# Patient Record
Sex: Male | Born: 1970 | Race: White | Hispanic: No | Marital: Married | Smoking: Never smoker
Health system: Southern US, Community
[De-identification: ages and names within clinical notes are randomized; demographics above are authoritative.]

## PROBLEM LIST (undated history)

## (undated) DIAGNOSIS — T7840XA Allergy, unspecified, initial encounter: Secondary | ICD-10-CM

## (undated) DIAGNOSIS — R001 Bradycardia, unspecified: Secondary | ICD-10-CM

## (undated) DIAGNOSIS — K219 Gastro-esophageal reflux disease without esophagitis: Secondary | ICD-10-CM

## (undated) DIAGNOSIS — N200 Calculus of kidney: Secondary | ICD-10-CM

## (undated) DIAGNOSIS — G4730 Sleep apnea, unspecified: Secondary | ICD-10-CM

## (undated) DIAGNOSIS — I10 Essential (primary) hypertension: Secondary | ICD-10-CM

## (undated) DIAGNOSIS — D369 Benign neoplasm, unspecified site: Secondary | ICD-10-CM

## (undated) HISTORY — DX: Allergy, unspecified, initial encounter: T78.40XA

## (undated) HISTORY — DX: Benign neoplasm, unspecified site: D36.9

## (undated) HISTORY — DX: Gastro-esophageal reflux disease without esophagitis: K21.9

## (undated) HISTORY — PX: CARPAL TUNNEL RELEASE: SHX101

## (undated) HISTORY — PX: POLYPECTOMY: SHX149

## (undated) HISTORY — PX: WISDOM TOOTH EXTRACTION: SHX21

## (undated) HISTORY — PX: COLONOSCOPY: SHX174

## (undated) HISTORY — DX: Bradycardia, unspecified: R00.1

## (undated) HISTORY — PX: SHOULDER SURGERY: SHX246

## (undated) HISTORY — DX: Essential (primary) hypertension: I10

## (undated) HISTORY — DX: Sleep apnea, unspecified: G47.30

## (undated) HISTORY — DX: Calculus of kidney: N20.0

---

## 2002-06-01 ENCOUNTER — Inpatient Hospital Stay (HOSPITAL_COMMUNITY): Admission: EM | Admit: 2002-06-01 | Discharge: 2002-06-03 | Payer: Self-pay | Admitting: Cardiology

## 2012-12-09 DIAGNOSIS — M5412 Radiculopathy, cervical region: Secondary | ICD-10-CM | POA: Insufficient documentation

## 2014-01-11 DIAGNOSIS — S069XAA Unspecified intracranial injury with loss of consciousness status unknown, initial encounter: Secondary | ICD-10-CM | POA: Insufficient documentation

## 2014-06-21 ENCOUNTER — Encounter: Payer: Self-pay | Admitting: Internal Medicine

## 2014-08-31 ENCOUNTER — Encounter: Payer: Self-pay | Admitting: Internal Medicine

## 2014-08-31 ENCOUNTER — Ambulatory Visit (INDEPENDENT_AMBULATORY_CARE_PROVIDER_SITE_OTHER): Payer: Managed Care, Other (non HMO) | Admitting: Internal Medicine

## 2014-08-31 VITALS — BP 128/68 | HR 60 | Ht 65.25 in | Wt 288.1 lb

## 2014-08-31 DIAGNOSIS — R194 Change in bowel habit: Secondary | ICD-10-CM

## 2014-08-31 DIAGNOSIS — R1310 Dysphagia, unspecified: Secondary | ICD-10-CM

## 2014-08-31 DIAGNOSIS — K219 Gastro-esophageal reflux disease without esophagitis: Secondary | ICD-10-CM

## 2014-08-31 DIAGNOSIS — K625 Hemorrhage of anus and rectum: Secondary | ICD-10-CM

## 2014-08-31 MED ORDER — MOVIPREP 100 G PO SOLR: 1.0000 | Freq: Once | ORAL | Status: DC

## 2014-08-31 MED ORDER — HYDROCORTISONE ACETATE 25 MG RE SUPP: 25.0000 mg | Freq: Every day | RECTAL | Status: DC

## 2014-08-31 NOTE — Patient Instructions (Signed)
You have been scheduled for an endoscopy and colonoscopy. Please follow the written instructions given to you at your visit today. Please pick up your prep at the pharmacy within the next 1-3 days. If you use inhalers (even only as needed), please bring them with you on the day of your procedure. Your physician has requested that you go to www.startemmi.com and enter the access code given to you at your visit today. This web site gives a general overview about your procedure. However, you should still follow specific instructions given to you by our office regarding your preparation for the procedure.   As discussed with Dr. Henrene Pastor, use Metamucil, 1-2 tablespoons mixed with juice or water  We have sent the following medications to your pharmacy for you to pick up at your convenience:  Anusol [facility] suppositories  Continue taking Dexilant every morning.

## 2014-08-31 NOTE — Progress Notes (Signed)
HISTORY OF PRESENT ILLNESS:  Roy Fuentes is a 43 y.o. male , self-referred by Roy Fuentes (cardiology nurse in [place]) who accompanies him today. He has a history of sleep apnea and morbid obesity. He presents for evaluation of new onset dysphagia, chronic GERD, change in bowel habits with rectal bleeding, and a concern over the family history of Crohn's disease. The patient reports long-standing reflux symptoms. Over the past 3 months he has developed intermittent solid food dysphagia. Roy Fuentes has self treated him with Dexilant samples which he takes sporadically with improvement in symptoms. No abdominal pain or weight loss. Next, he reports some difficulty with Roy bowel habits. He has seen rectal bleeding intermittently. Generally mild though can be more prominent with blood in the toilet bowl. He has had spontaneous leakage of mucus-type fluid in the undergarments. He has used medicated hydrocortisone suppositories for probable hemorrhoids. Roy father had delayed diagnosis Crohn's disease with significant morbidity requiring surgery. He works in [employer].  REVIEW OF SYSTEMS:  All non-GI ROS negative upon comprehensive review  Past Medical History  Diagnosis Date  . GERD (gastroesophageal reflux disease)   . Kidney stones   . Sleep apnea     Past Surgical History  Procedure Laterality Date  . Carpal tunnel release      Right & Left Hand    Social History [place]  reports that he has never smoked. He has never used smokeless tobacco. He reports that he does not drink alcohol or use illicit drugs.  family history includes Colon polyps in Roy father; Crohn's disease in Roy father; Diabetes in Roy paternal grandmother; Heart disease in Roy father; Irritable bowel syndrome in Roy father; Kidney disease in Roy father. There is no history of Colon cancer or Esophageal cancer.  Allergies  Allergen Reactions  . Codeine Nausea And Vomiting       PHYSICAL  EXAMINATION: Vital signs: BP 128/68 mmHg  Pulse 60  Ht 5' 5.25" (1.657 m)  Wt 288 lb 2 oz (130.693 kg)  BMI 47.60 kg/m2  Constitutional: Obese, pleasant, generally well-appearing, no acute distress Psychiatric: alert and oriented x3, cooperative Eyes: extraocular movements intact, anicteric, conjunctiva pink Mouth: oral pharynx moist, no lesions Neck: supple no lymphadenopathy Cardiovascular: heart regular rate and rhythm, no murmur Lungs: clear to auscultation bilaterally Abdomen: soft, obese, nontender, nondistended, no obvious ascites, no peritoneal signs, normal bowel sounds, no organomegaly Rectal: Deferred until colonoscopy will Extremities: no lower extremity edema bilaterally Skin: no lesions on visible extremities Neuro: No focal deficits.   ASSESSMENT:  #1. Chronic GERD. Ongoing #2. Recent onset intermittent solid food dysphagia. Rule out peptic stricture #3. Difficulty with bowel habits, rectal bleeding, and soilage. Rule out benign anorectal pathology versus proctitis/colitis #4. Family history of Crohn's disease #5. Morbid obesity with sleep apnea  PLAN:  #1. Reflux precautions with attention to weight loss #2. Resume Dexilant, but take daily #3. Used medicated rectal suppositories nightly #4. Upper endoscopy with possible esophageal dilation.The nature of the procedure, as well as the risks, benefits, and alternatives were carefully and thoroughly reviewed with the patient. Ample time for discussion and questions allowed. The patient understood, was satisfied, and agreed to proceed. #5. Diagnostic colonoscopy. The nature of the procedure, as well as the risks, benefits, and alternatives were carefully and thoroughly reviewed with the patient. Ample time for discussion and questions allowed. The patient understood, was satisfied, and agreed to proceed. Movi prep prescribed. Patient instructed on its use

## 2014-09-05 ENCOUNTER — Ambulatory Visit (AMBULATORY_SURGERY_CENTER): Payer: Managed Care, Other (non HMO) | Admitting: Internal Medicine

## 2014-09-05 ENCOUNTER — Encounter: Payer: Self-pay | Admitting: Internal Medicine

## 2014-09-05 VITALS — BP 108/59 | HR 56 | Temp 96.9°F | Resp 22 | Ht 65.25 in | Wt 288.0 lb

## 2014-09-05 DIAGNOSIS — D123 Benign neoplasm of transverse colon: Secondary | ICD-10-CM

## 2014-09-05 DIAGNOSIS — K625 Hemorrhage of anus and rectum: Secondary | ICD-10-CM

## 2014-09-05 DIAGNOSIS — K219 Gastro-esophageal reflux disease without esophagitis: Secondary | ICD-10-CM

## 2014-09-05 MED ORDER — SODIUM CHLORIDE 0.9 % IV SOLN: 500.0000 mL | INTRAVENOUS | Status: DC

## 2014-09-05 NOTE — Op Note (Signed)
[place]  [address] [place], [id number]   COLONOSCOPY PROCEDURE REPORT  PATIENT: Roy Fuentes, Roy Fuentes  MR#: [id number] BIRTHDATE: September 10, 1971 , 58  yrs. old GENDER: male ENDOSCOPIST: Eustace Quail, MD REFERRED BY:.  Self / Office PROCEDURE DATE:  09/05/2014 PROCEDURE:   Colonoscopy with snare polypectomy x 1 First Screening Colonoscopy - Avg.  risk and is 50 yrs.  old or older - No.  Prior Negative Screening - Now for repeat screening. N/A  History of Adenoma - Now for follow-up colonoscopy & has been > or = to 3 yrs.  N/A  Polyps Removed Today? Yes. ASA CLASS:   Class II INDICATIONS:rectal bleeding and change in bowel habits. MEDICATIONS: Monitored anesthesia care and Propofol 300 mg IV  DESCRIPTION OF PROCEDURE:   After the risks benefits and alternatives of the procedure were thoroughly explained, informed consent was obtained.  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   The LB [id number]  endoscope was introduced through the anus and advanced to the cecum, which was identified by both the appendix and ileocecal valve. No adverse events experienced.   The quality of the prep was excellent, using MoviPrep  The instrument was then slowly withdrawn as the colon was fully examined.  COLON FINDINGS: A 5 mm  polyp was found in the transverse colon.  A polypectomy was performed with a cold snare.  The resection was complete, and tissue completely retrieved and sent to histology. There was mild diverticulosis in the left colon.   The examined terminal ileum was normal.   The examination was otherwise normal. Retroflexed views revealed internal hemorrhoids. The time to cecum=2 minutes 22 seconds.  Withdrawal time=8 minutes 34 seconds. The scope was withdrawn and the procedure completed. COMPLICATIONS: There were no immediate complications.  ENDOSCOPIC IMPRESSION: 1.   Single polyp measuring 5 mm in size was found in the  transverse colon; polypectomy was performed with a cold snare 2.   Mild diverticulosis was noted in the left colon 3.   The examined terminal ileum appeared to be normal 4.   The examination was otherwise normal  RECOMMENDATIONS: 1. Repeat colonoscopy in 5 years if polyp adenomatous; otherwise 10 years 2. Continue medicated suppositories at night until symptoms resolve 3. Start Metamucil 2 tablespoons daily and 12 ounces of water or juice 4. Upper endoscopy today (see report)  eSigned:  Eustace Quail, MD 09/05/2014 1:00 PM   cc: The Patient

## 2014-09-05 NOTE — Progress Notes (Signed)
Called to room to assist during endoscopic procedure.  Patient ID and intended procedure confirmed with present staff. Received instructions for my participation in the procedure from the performing physician.  

## 2014-09-05 NOTE — Patient Instructions (Signed)
YOU HAD AN ENDOSCOPIC PROCEDURE TODAY AT THE [facility]: Refer to the procedure report that was given to you for any specific questions about what was found during the examination.  If the procedure report does not answer your questions, please call your gastroenterologist to clarify.  If you requested that your care partner not be given the details of your procedure findings, then the procedure report has been included in a sealed envelope for you to review at your convenience later.  YOU SHOULD EXPECT: Some feelings of bloating in the abdomen. Passage of more gas than usual.  Walking can help get rid of the air that was put into your GI tract during the procedure and reduce the bloating. If you had a lower endoscopy (such as a colonoscopy or flexible sigmoidoscopy) you may notice spotting of blood in your stool or on the toilet paper. If you underwent a bowel prep for your procedure, then you may not have a normal bowel movement for a few days.  DIET: Your first meal following the procedure should be a light meal and then it is ok to progress to your normal diet.  A half-sandwich or bowl of soup is an example of a good first meal.  Heavy or fried foods are harder to digest and may make you feel nauseous or bloated.  Likewise meals heavy in dairy and vegetables can cause extra gas to form and this can also increase the bloating.  Drink plenty of fluids but you should avoid alcoholic beverages for 24 hours.  ACTIVITY: Your care partner should take you home directly after the procedure.  You should plan to take it easy, moving slowly for the rest of the day.  You can resume normal activity the day after the procedure however you should NOT DRIVE or use heavy machinery for 24 hours (because of the sedation medicines used during the test).    SYMPTOMS TO REPORT IMMEDIATELY: A gastroenterologist can be reached at any hour.  During normal business hours, 8:30 AM to 5:00 PM Monday through Friday,  call [phone].  After hours and on weekends, please call the GI answering service at [phone] who will take a message and have the physician on call contact you.   Following lower endoscopy (colonoscopy or flexible sigmoidoscopy):  Excessive amounts of blood in the stool  Significant tenderness or worsening of abdominal pains  Swelling of the abdomen that is new, acute  Fever of 100F or higher  FOLLOW UP: If any biopsies were taken you will be contacted by phone or by letter within the next 1-3 weeks.  Call your gastroenterologist if you have not heard about the biopsies in 3 weeks.  Our staff will call the home number listed on your records the next business day following your procedure to check on you and address any questions or concerns that you may have at that time regarding the information given to you following your procedure. This is a courtesy call and so if there is no answer at the home number and we have not heard from you through the emergency physician on call, we will assume that you have returned to your regular daily activities without incident.  SIGNATURES/CONFIDENTIALITY: You and/or your care partner have signed paperwork which will be entered into your electronic medical record.  These signatures attest to the fact that that the information above on your After Visit Summary has been reviewed and is understood.  Full responsibility of the confidentiality of this   discharge information lies with you and/or your care-partner.  GERD information given. Continue Dexilant once daily.  Polyp, diverticulosis, high fiber diet information given. Continue medicated suppositories at night until symptoms resolve. Metamucil 2 Tablespoons daily and 12oz  mixed with juice or water.

## 2014-09-05 NOTE — Op Note (Signed)
[place]  [address] [place], [id number]   ENDOSCOPY PROCEDURE REPORT  PATIENT: Roy Fuentes, Roy Fuentes  MR#: [id number] BIRTHDATE: 26-Jul-1971 , 26  yrs. old GENDER: male ENDOSCOPIST: Eustace Quail, MD REFERRED BY:  .  Self / Office PROCEDURE DATE:  09/05/2014 PROCEDURE:  EGD, diagnostic ASA CLASS:     Class II INDICATIONS:  history of esophageal reflux(chronic). Mild intermittent dysphagia to solids. Recently started on PPI regularly. MEDICATIONS: Monitored anesthesia care and Propofol 100 mg IV TOPICAL ANESTHETIC: none  DESCRIPTION OF PROCEDURE: After the risks benefits and alternatives of the procedure were thoroughly explained, informed consent was obtained.  The LB [id number] endoscope was introduced through the mouth and advanced to the second portion of the duodenum , Without limitations.  The instrument was slowly withdrawn as the mucosa was fully examined.    EXAM: The esophagus and gastroesophageal junction were completely normal in appearance.  The stomach was entered and closely examined.The antrum, angularis, and lesser curvature were well visualized, including a retroflexed view of the cardia and fundus. The stomach wall was normally distensable.  The scope passed easily through the pylorus into the duodenum.  Retroflexed views revealed no abnormalities.     The scope was then withdrawn from the patient and the procedure completed.  COMPLICATIONS: There were no immediate complications.  ENDOSCOPIC IMPRESSION: 1. Normal EGD 2. GERD  RECOMMENDATIONS: 1.  Anti-reflux regimen to be followed (diet, weight loss, and other measures) 2.  Continue PPI  (currently on Dexilant once daily)  REPEAT EXAM:  eSigned:  Eustace Quail, MD 09/05/2014 1:04 PM    CC:The Patient

## 2014-09-05 NOTE — Progress Notes (Signed)
Patient awakening,vss,report to rn 

## 2014-09-10 ENCOUNTER — Telehealth: Payer: Self-pay | Admitting: *Deleted

## 2014-09-10 NOTE — Telephone Encounter (Signed)
  Follow up Call-  Call back number 09/05/2014  Post procedure Call Back phone  # [phone]  Permission to leave phone message Yes     No answer at number given.  Left message on voicemail.

## 2014-09-11 ENCOUNTER — Encounter: Payer: Self-pay | Admitting: Internal Medicine

## 2016-04-28 ENCOUNTER — Encounter: Payer: Self-pay | Admitting: Cardiology

## 2017-01-14 ENCOUNTER — Encounter: Payer: Self-pay | Admitting: *Deleted

## 2017-01-15 ENCOUNTER — Encounter: Payer: Self-pay | Admitting: Cardiology

## 2017-01-15 ENCOUNTER — Ambulatory Visit (INDEPENDENT_AMBULATORY_CARE_PROVIDER_SITE_OTHER): Payer: Managed Care, Other (non HMO) | Admitting: Cardiology

## 2017-01-15 VITALS — BP 106/66 | HR 60 | Ht 67.0 in | Wt 304.0 lb

## 2017-01-15 DIAGNOSIS — R002 Palpitations: Secondary | ICD-10-CM

## 2017-01-15 DIAGNOSIS — R0789 Other chest pain: Secondary | ICD-10-CM

## 2017-01-15 DIAGNOSIS — R001 Bradycardia, unspecified: Secondary | ICD-10-CM

## 2017-01-15 NOTE — Progress Notes (Signed)
Clinical Summary Mr. Suchy is a 46 y.o.male seen as new patient, last seen by Dr Ron Parker several years ago  1. Chest pain - cath 2003: no significant CAD - no recent symptoms.    2. Palpitations - episode a few weeks ago after drinking coffee. Manually felt pulse, felt regular with some skipped beats - similar episode last year while taking antihistamines - denies any regular palpitations. No significant lighthenadess dizziness.   3. Bradycardia - reports long history of chronic low heart rates. - no recenty lightheadedness or dizziness    Past Medical History:  Diagnosis Date  . GERD (gastroesophageal reflux disease)   . Kidney stones   . Sleep apnea      Allergies  Allergen Reactions  . Codeine Nausea And Vomiting     Current Outpatient Prescriptions  Medication Sig Dispense Refill  . cetirizine (ZYRTEC) 10 MG chewable tablet Chew 10 mg by mouth daily at 10 pm.    . dexlansoprazole (DEXILANT) 60 MG capsule Take 60 mg by mouth as needed.    . diclofenac (VOLTAREN) 75 MG EC tablet Take 75 mg by mouth 2 (two) times daily.    . hydrocortisone (ANUSOL-HC) 25 MG suppository   6  . ibuprofen (ADVIL,MOTRIN) 800 MG tablet Take by mouth.     No current facility-administered medications for this visit.      Past Surgical History:  Procedure Laterality Date  . CARPAL TUNNEL RELEASE     Right & Left Hand     Allergies  Allergen Reactions  . Codeine Nausea And Vomiting      Family History  Problem Relation Age of Onset  . Colon polyps Father   . Crohn's disease Father   . Heart disease Father   . Irritable bowel syndrome Father   . Kidney disease Father   . Diabetes Paternal Grandmother   . Colon cancer Neg Hx   . Esophageal cancer Neg Hx      Social History Mr. Holland reports that he has never smoked. He has never used smokeless tobacco. Mr. Ostrom reports that he does not drink alcohol.   Review of Systems CONSTITUTIONAL: No weight loss,  fever, chills, weakness or fatigue.  HEENT: Eyes: No visual loss, blurred vision, double vision or yellow sclerae.No hearing loss, sneezing, congestion, runny nose or sore throat.  SKIN: No rash or itching.  CARDIOVASCULAR: per hpi RESPIRATORY: No shortness of breath, cough or sputum.  GASTROINTESTINAL: No anorexia, nausea, vomiting or diarrhea. No abdominal pain or blood.  GENITOURINARY: No burning on urination, no polyuria NEUROLOGICAL: No headache, dizziness, syncope, paralysis, ataxia, numbness or tingling in the extremities. No change in bowel or bladder control.  MUSCULOSKELETAL: No muscle, back pain, joint pain or stiffness.  LYMPHATICS: No enlarged nodes. No history of splenectomy.  PSYCHIATRIC: No history of depression or anxiety.  ENDOCRINOLOGIC: No reports of sweating, cold or heat intolerance. No polyuria or polydipsia.  Marland Kitchen   Physical Examination Vitals:   01/15/17 1434 01/15/17 1439  BP: 121/78 106/66  Pulse: 61 60   Vitals:   01/15/17 1434  Weight: (!) 304 lb (137.9 kg)  Height: 5\' 7"  (1.702 m)    Gen: resting comfortably, no acute distress HEENT: no scleral icterus, pupils equal round and reactive, no palptable cervical adenopathy,  CV: RRR, no m/r/g, no jvd Resp: Clear to auscultation bilaterally GI: abdomen is soft, non-tender, non-distended, normal bowel sounds, no hepatosplenomegaly MSK: extremities are warm, no edema.  Skin: warm, no rash Neuro:  no focal deficits Psych: appropriate affect    Assessment and Plan  1. Palpitations - fairly rare episodes, most recently brought on by caffeine - continue to monitor at this time, if increase consider home monitor  2. Bradycardia - patient reports history of chronic sinus bradycardia - rates today low normal, no symptoms. Continue to monitor  3. Chest pain - previous history with negative cath. No recent symptoms - continue to monitor.       Arnoldo Lenis, M.D.

## 2017-01-15 NOTE — Patient Instructions (Signed)

## 2017-12-17 ENCOUNTER — Ambulatory Visit (INDEPENDENT_AMBULATORY_CARE_PROVIDER_SITE_OTHER): Payer: Managed Care, Other (non HMO) | Admitting: Cardiology

## 2017-12-17 ENCOUNTER — Encounter: Payer: Self-pay | Admitting: *Deleted

## 2017-12-17 ENCOUNTER — Encounter: Payer: Self-pay | Admitting: Cardiology

## 2017-12-17 VITALS — BP 156/92 | HR 61 | Ht 67.0 in | Wt 304.8 lb

## 2017-12-17 DIAGNOSIS — R0789 Other chest pain: Secondary | ICD-10-CM

## 2017-12-17 DIAGNOSIS — R002 Palpitations: Secondary | ICD-10-CM | POA: Diagnosis not present

## 2017-12-17 DIAGNOSIS — R001 Bradycardia, unspecified: Secondary | ICD-10-CM

## 2017-12-17 DIAGNOSIS — I10 Essential (primary) hypertension: Secondary | ICD-10-CM | POA: Diagnosis not present

## 2017-12-17 MED ORDER — AMLODIPINE BESYLATE 5 MG PO TABS: 5.0000 mg | ORAL_TABLET | Freq: Every day | ORAL | 3 refills | Status: DC

## 2017-12-17 NOTE — Patient Instructions (Addendum)
Medication Instructions:  Your physician has recommended you make the following change in your medication: Start Norvasc 5 mg Daily    Labwork: NONE   Testing/Procedures: NONE   Follow-Up: Your physician wants you to follow-up in: 6 months with Dr. Harl Bowie. You will receive a reminder letter in the mail two months in advance. If you don't receive a letter, please call our office to schedule the follow-up appointment.   Any Other Special Instructions Will Be Listed Below (If Applicable). Your physician has requested that you regularly monitor and record your blood pressure readings at home for 2 weeks. Please use the same machine at the same time of day to check your readings and record them to bring to your follow-up visit.      If you need a refill on your cardiac medications before your next appointment, please call your pharmacy. Thank you for choosing [facility]!   DASH Eating Plan DASH stands for "Dietary Approaches to Stop Hypertension." The DASH eating plan is a healthy eating plan that has been shown to reduce high blood pressure (hypertension). It may also reduce your risk for type 2 diabetes, heart disease, and stroke. The DASH eating plan may also help with weight loss. What are tips for following this plan? General guidelines  Avoid eating more than 2,300 mg (milligrams) of salt (sodium) a day. If you have hypertension, you may need to reduce your sodium intake to 1,500 mg a day.  Limit alcohol intake to no more than 1 drink a day for nonpregnant women and 2 drinks a day for men. One drink equals 12 oz of beer, 5 oz of wine, or 1 oz of hard liquor.  Work with your health care provider to maintain a healthy body weight or to lose weight. Ask what an ideal weight is for you.  Get at least 30 minutes of exercise that causes your heart to beat faster (aerobic exercise) most days of the week. Activities may include walking, swimming, or biking.  Work with your  health care provider or diet and nutrition specialist (dietitian) to adjust your eating plan to your individual calorie needs. Reading food labels  Check food labels for the amount of sodium per serving. Choose foods with less than 5 percent of the Daily Value of sodium. Generally, foods with less than 300 mg of sodium per serving fit into this eating plan.  To find whole grains, look for the word "whole" as the first word in the ingredient list. Shopping  Buy products labeled as "low-sodium" or "no salt added."  Buy fresh foods. Avoid canned foods and premade or frozen meals. Cooking  Avoid adding salt when cooking. Use salt-free seasonings or herbs instead of table salt or sea salt. Check with your health care provider or pharmacist before using salt substitutes.  Do not fry foods. Cook foods using healthy methods such as baking, boiling, grilling, and broiling instead.  Cook with heart-healthy oils, such as olive, canola, soybean, or sunflower oil. Meal planning   Eat a balanced diet that includes: ? 5 or more servings of fruits and vegetables each day. At each meal, try to fill half of your plate with fruits and vegetables. ? Up to 6-8 servings of whole grains each day. ? Less than 6 oz of lean meat, poultry, or fish each day. A 3-oz serving of meat is about the same size as a deck of cards. One egg equals 1 oz. ? 2 servings of low-fat dairy each day. ?  A serving of nuts, seeds, or beans 5 times each week. ? Heart-healthy fats. Healthy fats called Omega-3 fatty acids are found in foods such as flaxseeds and coldwater fish, like sardines, salmon, and mackerel.  Limit how much you eat of the following: ? Canned or prepackaged foods. ? Food that is high in trans fat, such as fried foods. ? Food that is high in saturated fat, such as fatty meat. ? Sweets, desserts, sugary drinks, and other foods with added sugar. ? Full-fat dairy products.  Do not salt foods before eating.  Try  to eat at least 2 vegetarian meals each week.  Eat more home-cooked food and less restaurant, buffet, and fast food.  When eating at a restaurant, ask that your food be prepared with less salt or no salt, if possible. What foods are recommended? The items listed may not be a complete list. Talk with your dietitian about what dietary choices are best for you. Grains Whole-grain or whole-wheat bread. Whole-grain or whole-wheat pasta. Brown rice. [place]. Bulgur. Whole-grain and low-sodium cereals. Pita bread. Low-fat, low-sodium crackers. Whole-wheat flour tortillas. Vegetables Fresh or frozen vegetables (raw, steamed, roasted, or grilled). Low-sodium or reduced-sodium tomato and vegetable juice. Low-sodium or reduced-sodium tomato sauce and tomato paste. Low-sodium or reduced-sodium canned vegetables. Fruits All fresh, dried, or frozen fruit. Canned fruit in natural juice (without added sugar). Meat and other protein foods Skinless chicken or [place]. Ground chicken or [place]. Pork with fat trimmed off. Fish and seafood. Egg whites. Dried beans, peas, or lentils. Unsalted nuts, nut butters, and seeds. Unsalted canned beans. Lean cuts of beef with fat trimmed off. Low-sodium, lean deli meat. Dairy Low-fat (1%) or fat-free (skim) milk. Fat-free, low-fat, or reduced-fat cheeses. Nonfat, low-sodium ricotta or cottage cheese. Low-fat or nonfat yogurt. Low-fat, low-sodium cheese. Fats and oils Soft margarine without trans fats. Vegetable oil. Low-fat, reduced-fat, or light mayonnaise and salad dressings (reduced-sodium). Canola, safflower, olive, soybean, and sunflower oils. Avocado. Seasoning and other foods Herbs. Spices. Seasoning mixes without salt. Unsalted popcorn and pretzels. Fat-free sweets. What foods are not recommended? The items listed may not be a complete list. Talk with your dietitian about what dietary choices are best for you. Grains Baked goods made with fat, such as  croissants, muffins, or some breads. Dry pasta or rice meal packs. Vegetables Creamed or fried vegetables. Vegetables in a cheese sauce. Regular canned vegetables (not low-sodium or reduced-sodium). Regular canned tomato sauce and paste (not low-sodium or reduced-sodium). Regular tomato and vegetable juice (not low-sodium or reduced-sodium). Angie Fava. Olives. Fruits Canned fruit in a light or heavy syrup. Fried fruit. Fruit in cream or butter sauce. Meat and other protein foods Fatty cuts of meat. Ribs. Fried meat. Berniece Salines. Sausage. Bologna and other processed lunch meats. Salami. Fatback. Hotdogs. Bratwurst. Salted nuts and seeds. Canned beans with added salt. Canned or smoked fish. Whole eggs or egg yolks. Chicken or [place] with skin. Dairy Whole or 2% milk, cream, and half-and-half. Whole or full-fat cream cheese. Whole-fat or sweetened yogurt. Full-fat cheese. Nondairy creamers. Whipped toppings. Processed cheese and cheese spreads. Fats and oils Butter. Stick margarine. Lard. Shortening. Ghee. Bacon fat. Tropical oils, such as coconut, palm kernel, or palm oil. Seasoning and other foods Salted popcorn and pretzels. Onion salt, garlic salt, seasoned salt, table salt, and sea salt. Worcestershire sauce. Tartar sauce. Barbecue sauce. Teriyaki sauce. Soy sauce, including reduced-sodium. Steak sauce. Canned and packaged gravies. Fish sauce. Oyster sauce. Cocktail sauce. Horseradish that you find on the shelf. Ketchup.  Mustard. Meat flavorings and tenderizers. Bouillon cubes. Hot sauce and Tabasco sauce. Premade or packaged marinades. Premade or packaged taco seasonings. Relishes. Regular salad dressings. Where to find more information:  National Heart, Lung, and [place]: https://wilson-eaton.com/  American Heart Association: www.heart.org Summary  The DASH eating plan is a healthy eating plan that has been shown to reduce high blood pressure (hypertension). It may also reduce your risk for type 2  diabetes, heart disease, and stroke.  With the DASH eating plan, you should limit salt (sodium) intake to 2,300 mg a day. If you have hypertension, you may need to reduce your sodium intake to 1,500 mg a day.  When on the DASH eating plan, aim to eat more fresh fruits and vegetables, whole grains, lean proteins, low-fat dairy, and heart-healthy fats.  Work with your health care provider or diet and nutrition specialist (dietitian) to adjust your eating plan to your individual calorie needs. This information is not intended to replace advice given to you by your health care provider. Make sure you discuss any questions you have with your health care provider. Document Released: 09/17/2011 Document Revised: 09/21/2016 Document Reviewed: 09/21/2016 Elsevier Interactive Patient Education  [employer].

## 2017-12-17 NOTE — Progress Notes (Signed)
Clinical Summary Roy Fuentes is a 47 y.o.male seen today for follow up of the following medical problems.   1. Chest pain - cath 2003: no significant CAD  he denies any recent symptoms.     2. History of Palpitations - no recent symptoms.    3. Bradycardia - reports long history of chronic low heart rates. - denies any recent symptoms  4. Elevated bp - takes ibupforen for muscle pains and joint pains at home - takes 800mg  daily. Rare caffeine, no EtoH.   5. OSA - compliant with cpap - monitored by Dr Volanda Napoleon.      Past Medical History:  Diagnosis Date  . GERD (gastroesophageal reflux disease)   . Kidney stones   . Sleep apnea      Allergies  Allergen Reactions  . Codeine Nausea And Vomiting     Current Outpatient Medications  Medication Sig Dispense Refill  . cetirizine (ZYRTEC) 10 MG chewable tablet Chew 10 mg by mouth daily at 10 pm.    . dexlansoprazole (DEXILANT) 60 MG capsule Take 60 mg by mouth as needed.    . hydrocortisone (ANUSOL-HC) 25 MG suppository   6  . ibuprofen (ADVIL,MOTRIN) 800 MG tablet Take by mouth.    . Multiple Vitamin (MULTIVITAMIN) tablet Take 1 tablet by mouth daily.    . TURMERIC PO Take 200 mg by mouth daily.     No current facility-administered medications for this visit.      Past Surgical History:  Procedure Laterality Date  . CARPAL TUNNEL RELEASE     Right & Left Hand     Allergies  Allergen Reactions  . Codeine Nausea And Vomiting      Family History  Problem Relation Age of Onset  . Colon polyps Father   . Crohn's disease Father   . Heart disease Father   . Irritable bowel syndrome Father   . Kidney disease Father   . Diabetes Paternal Grandmother   . Colon cancer Neg Hx   . Esophageal cancer Neg Hx      Social History Roy Fuentes reports that  has never smoked. he has never used smokeless tobacco. Roy Fuentes reports that he does not drink alcohol.   Review of Systems CONSTITUTIONAL: No  weight loss, fever, chills, weakness or fatigue.  HEENT: Eyes: No visual loss, blurred vision, double vision or yellow sclerae.No hearing loss, sneezing, congestion, runny nose or sore throat.  SKIN: No rash or itching.  CARDIOVASCULAR: per hpi RESPIRATORY: No shortness of breath, cough or sputum.  GASTROINTESTINAL: No anorexia, nausea, vomiting or diarrhea. No abdominal pain or blood.  GENITOURINARY: No burning on urination, no polyuria NEUROLOGICAL: No headache, dizziness, syncope, paralysis, ataxia, numbness or tingling in the extremities. No change in bowel or bladder control.  MUSCULOSKELETAL: No muscle, back pain, joint pain or stiffness.  LYMPHATICS: No enlarged nodes. No history of splenectomy.  PSYCHIATRIC: No history of depression or anxiety.  ENDOCRINOLOGIC: No reports of sweating, cold or heat intolerance. No polyuria or polydipsia.  Marland Kitchen   Physical Examination Vitals:   12/17/17 1311  BP: (!) 156/92  Pulse: 61  SpO2: 98%   Vitals:   12/17/17 1311  Weight: (!) 304 lb 12.8 oz (138.3 kg)  Height: 5\' 7"  (1.702 m)    Gen: resting comfortably, no acute distress HEENT: no scleral icterus, pupils equal round and reactive, no palptable cervical adenopathy,  CV: RRR, no m/r/g, no jvd Resp: Clear to auscultation bilaterally GI: abdomen is  soft, non-tender, non-distended, normal bowel sounds, no hepatosplenomegaly MSK: extremities are warm, no edema.  Skin: warm, no rash Neuro:  no focal deficits Psych: appropriate affect   Diagnostic Studies     Assessment and Plan  1. Palpitations - no recent symptoms, continue to monitor  2. Chest pain - no recent symptoms, we will continue to monitor   3. Bradycardia - chronic and asymptomatic, continue to monitor at this time.   4. HTN - elevated in clinic -  submit bp log in 2 weeks. Given info on DASH diet - pending bp log, may require titration of bp meds.       Arnoldo Lenis, M.D

## 2017-12-21 ENCOUNTER — Encounter: Payer: Self-pay | Admitting: Cardiology

## 2018-08-19 ENCOUNTER — Ambulatory Visit (INDEPENDENT_AMBULATORY_CARE_PROVIDER_SITE_OTHER): Payer: Managed Care, Other (non HMO) | Admitting: Cardiology

## 2018-08-19 ENCOUNTER — Encounter: Payer: Self-pay | Admitting: *Deleted

## 2018-08-19 ENCOUNTER — Encounter: Payer: Self-pay | Admitting: Cardiology

## 2018-08-19 VITALS — BP 110/75 | HR 67 | Ht 67.0 in | Wt 301.0 lb

## 2018-08-19 DIAGNOSIS — R0789 Other chest pain: Secondary | ICD-10-CM

## 2018-08-19 DIAGNOSIS — R001 Bradycardia, unspecified: Secondary | ICD-10-CM

## 2018-08-19 DIAGNOSIS — I10 Essential (primary) hypertension: Secondary | ICD-10-CM

## 2018-08-19 MED ORDER — AMLODIPINE BESYLATE 5 MG PO TABS: 5.0000 mg | ORAL_TABLET | Freq: Every day | ORAL | 1 refills | Status: DC

## 2018-08-19 NOTE — Progress Notes (Signed)
Clinical Summary Roy Fuentes is a 47 y.o.male seen today for follow up of the following medical problems.    1.Chest pain - cath 2003: no significant CAD - no recent chest pain, SOB or DOE.    2. Bradycardia - reports long history of chronic low heart rates. - no recent lightheadedness or dizziness  3. HTN - on norvasc 5mg  daily. Can feel sluggish, Roy Fuentes is unsure if related. Reports also started on flomax, since recently stopping has started to feel better.       4. OSA - compliant with cpap - monitored by Dr Volanda Napoleon. Normal check recently.  Past Medical History:  Diagnosis Date  . GERD (gastroesophageal reflux disease)   . Kidney stones   . Sleep apnea      Allergies  Allergen Reactions  . Codeine Nausea And Vomiting     Current Outpatient Medications  Medication Sig Dispense Refill  . amLODipine (NORVASC) 5 MG tablet Take 1 tablet (5 mg total) by mouth daily. 90 tablet 3  . cetirizine (ZYRTEC) 10 MG chewable tablet Chew 10 mg by mouth daily at 10 pm.    . ibuprofen (ADVIL,MOTRIN) 800 MG tablet Take by mouth.    . metaxalone (SKELAXIN) 800 MG tablet Take 800 mg by mouth as needed.     . Multiple Vitamin (MULTIVITAMIN) tablet Take 1 tablet by mouth daily.     No current facility-administered medications for this visit.      Past Surgical History:  Procedure Laterality Date  . CARPAL TUNNEL RELEASE     Right & Left Hand     Allergies  Allergen Reactions  . Codeine Nausea And Vomiting      Family History  Problem Relation Age of Onset  . Colon polyps Father   . Crohn's disease Father   . Heart disease Father   . Irritable bowel syndrome Father   . Kidney disease Father   . Hypertension Father   . Heart failure Mother   . Atrial fibrillation Mother   . Diabetes Paternal Grandmother   . Colon cancer Neg Hx   . Esophageal cancer Neg Hx      Social History Mr. Mckim reports that Roy Fuentes has never smoked. Roy Fuentes has never used smokeless  tobacco. Mr. Figgs reports that Roy Fuentes does not drink alcohol.   Review of Systems CONSTITUTIONAL: recent fatigue  HEENT: Eyes: No visual loss, blurred vision, double vision or yellow sclerae.No hearing loss, sneezing, congestion, runny nose or sore throat.  SKIN: No rash or itching.  CARDIOVASCULAR: per hpi RESPIRATORY: No shortness of breath, cough or sputum.  GASTROINTESTINAL: No anorexia, nausea, vomiting or diarrhea. No abdominal pain or blood.  GENITOURINARY: No burning on urination, no polyuria NEUROLOGICAL: No headache, dizziness, syncope, paralysis, ataxia, numbness or tingling in the extremities. No change in bowel or bladder control.  MUSCULOSKELETAL: No muscle, back pain, joint pain or stiffness.  LYMPHATICS: No enlarged nodes. No history of splenectomy.  PSYCHIATRIC: No history of depression or anxiety.  ENDOCRINOLOGIC: No reports of sweating, cold or heat intolerance. No polyuria or polydipsia.  Marland Kitchen   Physical Examination Vitals:   08/19/18 1530  BP: 110/75  Pulse: 67  SpO2: 96%   Vitals:   08/19/18 1530  Weight: (!) 301 lb (136.5 kg)  Height: 5\' 7"  (1.702 m)    Gen: resting comfortably, no acute distress HEENT: no scleral icterus, pupils equal round and reactive, no palptable cervical adenopathy,  CV: RRR, no m/r/g, no jvd Resp: Clear  to auscultation bilaterally GI: abdomen is soft, non-tender, non-distended, normal bowel sounds, no hepatosplenomegaly MSK: extremities are warm, no edema.  Skin: warm, no rash Neuro:  no focal deficits Psych: appropriate affect     Assessment and Plan  1. Chest pain - denies any recent symptoms, continue to monitor.   2. Bradycardia - chronic and asymptomatic - continue to monitor.   3. HTN - looks good today in clinic. Roy Fuentes thinks Roy Fuentes recent sluggishness feelings may be related to either the norvasc or flomax, Roy Fuentes has recentlty stopped the flomax and is feeling better. If becomes a problems again could try changing him  off norvac.       Arnoldo Lenis, M.D.

## 2018-08-19 NOTE — Patient Instructions (Signed)

## 2018-08-22 ENCOUNTER — Encounter: Payer: Self-pay | Admitting: *Deleted

## 2018-08-22 ENCOUNTER — Encounter: Payer: Self-pay | Admitting: Cardiology

## 2019-03-23 ENCOUNTER — Telehealth: Payer: Self-pay | Admitting: Cardiology

## 2019-03-23 NOTE — Telephone Encounter (Signed)

## 2019-03-24 ENCOUNTER — Ambulatory Visit (INDEPENDENT_AMBULATORY_CARE_PROVIDER_SITE_OTHER): Payer: Managed Care, Other (non HMO) | Admitting: Cardiology

## 2019-03-24 ENCOUNTER — Ambulatory Visit: Payer: Managed Care, Other (non HMO) | Admitting: Cardiology

## 2019-03-24 ENCOUNTER — Encounter: Payer: Self-pay | Admitting: Cardiology

## 2019-03-24 ENCOUNTER — Other Ambulatory Visit: Payer: Self-pay

## 2019-03-24 VITALS — BP 109/68 | HR 62 | Temp 98.7°F | Ht 67.0 in | Wt 301.8 lb

## 2019-03-24 DIAGNOSIS — I10 Essential (primary) hypertension: Secondary | ICD-10-CM | POA: Diagnosis not present

## 2019-03-24 MED ORDER — AMLODIPINE BESYLATE 2.5 MG PO TABS: 2.5000 mg | ORAL_TABLET | Freq: Every day | ORAL | 1 refills | Status: DC

## 2019-03-24 NOTE — Progress Notes (Signed)
Clinical Summary Mr. Rominger is a 48 y.o.male seen today for follow up of the following medical problems.   1.Chest pain - cath 2003: no significant CAD - denies any recent symptoms   2. Bradycardia - reports long history of chronic low heart rates. -denies any significant symptoms  3.HTN  - taking norvasc 5mg  in AM causes significant fatigue. Taking 2.5 mg in evening, tolerating well.  - home bp's 110s-130s/60-70s  4. OSA - monitored by Dr Volanda Napoleon.Normal check recently  - compliant with cpap Past Medical History:  Diagnosis Date  . GERD (gastroesophageal reflux disease)   . Kidney stones   . Sleep apnea      Allergies  Allergen Reactions  . Codeine Nausea And Vomiting     Current Outpatient Medications  Medication Sig Dispense Refill  . amLODipine (NORVASC) 5 MG tablet Take 1 tablet (5 mg total) by mouth daily. 90 tablet 1  . cetirizine (ZYRTEC) 10 MG chewable tablet Chew 10 mg by mouth daily at 10 pm.    . ibuprofen (ADVIL,MOTRIN) 800 MG tablet Take by mouth.    . metaxalone (SKELAXIN) 800 MG tablet Take 800 mg by mouth as needed.     . Multiple Vitamin (MULTIVITAMIN) tablet Take 1 tablet by mouth daily.     No current facility-administered medications for this visit.      Past Surgical History:  Procedure Laterality Date  . CARPAL TUNNEL RELEASE     Right & Left Hand     Allergies  Allergen Reactions  . Codeine Nausea And Vomiting      Family History  Problem Relation Age of Onset  . Colon polyps Father   . Crohn's disease Father   . Heart disease Father   . Irritable bowel syndrome Father   . Kidney disease Father   . Hypertension Father   . Heart failure Mother   . Atrial fibrillation Mother   . Diabetes Paternal Grandmother   . Colon cancer Neg Hx   . Esophageal cancer Neg Hx      Social History Mr. Silveria reports that he has never smoked. He has never used smokeless tobacco. Mr. Dyar reports no history of  alcohol use.   Review of Systems CONSTITUTIONAL: No weight loss, fever, chills, weakness or fatigue.  HEENT: Eyes: No visual loss, blurred vision, double vision or yellow sclerae.No hearing loss, sneezing, congestion, runny nose or sore throat.  SKIN: No rash or itching.  CARDIOVASCULAR: per hpi RESPIRATORY: No shortness of breath, cough or sputum.  GASTROINTESTINAL: No anorexia, nausea, vomiting or diarrhea. No abdominal pain or blood.  GENITOURINARY: No burning on urination, no polyuria NEUROLOGICAL: No headache, dizziness, syncope, paralysis, ataxia, numbness or tingling in the extremities. No change in bowel or bladder control.  MUSCULOSKELETAL: No muscle, back pain, joint pain or stiffness.  LYMPHATICS: No enlarged nodes. No history of splenectomy.  PSYCHIATRIC: No history of depression or anxiety.  ENDOCRINOLOGIC: No reports of sweating, cold or heat intolerance. No polyuria or polydipsia.  Marland Kitchen   Physical Examination Today's Vitals   03/24/19 1556  BP: 109/68  Pulse: 62  Temp: 98.7 F (37.1 C)  SpO2: 96%  Weight: (!) 301 lb 12.8 oz (136.9 kg)  Height: 5\' 7"  (1.702 m)   Body mass index is 47.27 kg/m.  Gen: resting comfortably, no acute distress HEENT: no scleral icterus, pupils equal round and reactive, no palptable cervical adenopathy,  CV: RRR, no m/r/g, no jvd Resp: Clear to auscultation bilaterally GI: abdomen  is soft, non-tender, non-distended, normal bowel sounds, no hepatosplenomegaly MSK: extremities are warm, no edema.  Skin: warm, no rash Neuro:  no focal deficits Psych: appropriate affect     Assessment and Plan  1. HTN - bp's at goal, change norvasc to 2.5mg  daily like he has been taking, 5mg  daily causes fatigue   2. Severe obesity - discussed dietary changes, lifestyle changes.     F/u 1 year  Arnoldo Lenis, M.D.

## 2019-03-24 NOTE — Patient Instructions (Signed)
Your physician wants you to follow-up in: [place] will receive a reminder letter in the mail two months in advance. If you don't receive a letter, please call our office to schedule the follow-up appointment.  Your physician has recommended you make the following change in your medication:   DECREASE AMLODIPINE 2.5 MG DAILY   Thank you for choosing [facility]!!

## 2019-04-21 ENCOUNTER — Ambulatory Visit: Payer: Managed Care, Other (non HMO) | Admitting: Student

## 2019-06-20 ENCOUNTER — Other Ambulatory Visit: Payer: Self-pay

## 2019-06-20 ENCOUNTER — Emergency Department (HOSPITAL_COMMUNITY)
Admission: EM | Admit: 2019-06-20 | Discharge: 2019-06-20 | Disposition: A | Discharge status: Home / Self Care | Payer: Managed Care, Other (non HMO) | Attending: Emergency Medicine | Admitting: Emergency Medicine

## 2019-06-20 ENCOUNTER — Encounter (HOSPITAL_COMMUNITY): Payer: Self-pay | Admitting: Emergency Medicine

## 2019-06-20 ENCOUNTER — Emergency Department (HOSPITAL_COMMUNITY): Payer: Managed Care, Other (non HMO)

## 2019-06-20 DIAGNOSIS — Z79899 Other long term (current) drug therapy: Secondary | ICD-10-CM | POA: Diagnosis not present

## 2019-06-20 DIAGNOSIS — R0602 Shortness of breath: Secondary | ICD-10-CM | POA: Diagnosis present

## 2019-06-20 DIAGNOSIS — U071 COVID-19: Secondary | ICD-10-CM | POA: Diagnosis not present

## 2019-06-20 LAB — CBC WITH DIFFERENTIAL/PLATELET
Abs Immature Granulocytes: 1 10*3/uL — ABNORMAL HIGH (ref 0.00–0.07)
Basophils Absolute: 0.1 10*3/uL (ref 0.0–0.1)
Basophils Relative: 1 %
Eosinophils Absolute: 0 10*3/uL (ref 0.0–0.5)
Eosinophils Relative: 0 %
HCT: 46.4 % (ref 39.0–52.0)
Hemoglobin: 15.1 g/dL (ref 13.0–17.0)
Immature Granulocytes: 7 %
Lymphocytes Relative: 11 %
Lymphs Abs: 1.7 10*3/uL (ref 0.7–4.0)
MCH: 31.3 pg (ref 26.0–34.0)
MCHC: 32.5 g/dL (ref 30.0–36.0)
MCV: 96.1 fL (ref 80.0–100.0)
Monocytes Absolute: 1 10*3/uL (ref 0.1–1.0)
Monocytes Relative: 7 %
Neutro Abs: 11.1 10*3/uL — ABNORMAL HIGH (ref 1.7–7.7)
Neutrophils Relative %: 74 %
Platelets: 385 10*3/uL (ref 150–400)
RBC: 4.83 MIL/uL (ref 4.22–5.81)
RDW: 12.6 % (ref 11.5–15.5)
WBC: 15 10*3/uL — ABNORMAL HIGH (ref 4.0–10.5)
nRBC: 0 % (ref 0.0–0.2)

## 2019-06-20 LAB — TROPONIN I (HIGH SENSITIVITY)
Troponin I (High Sensitivity): 5 ng/L (ref <18)
Troponin I (High Sensitivity): 3 ng/L (ref <18)

## 2019-06-20 LAB — COMPREHENSIVE METABOLIC PANEL
ALT: 84 U/L — ABNORMAL HIGH (ref 0–44)
AST: 33 U/L (ref 15–41)
Albumin: 4.1 g/dL (ref 3.5–5.0)
Alkaline Phosphatase: 59 U/L (ref 38–126)
Anion gap: 9 (ref 5–15)
BUN: 20 mg/dL (ref 6–20)
CO2: 24 mmol/L (ref 22–32)
Calcium: 9.2 mg/dL (ref 8.9–10.3)
Chloride: 106 mmol/L (ref 98–111)
Creatinine, Ser: 0.77 mg/dL (ref 0.61–1.24)
GFR calc Af Amer: >60 mL/min (ref >60)
GFR calc non Af Amer: >60 mL/min (ref >60)
Glucose, Bld: 91 mg/dL (ref 70–99)
Potassium: 3.9 mmol/L (ref 3.5–5.1)
Sodium: 139 mmol/L (ref 135–145)
Total Bilirubin: 0.6 mg/dL (ref 0.3–1.2)
Total Protein: 7.5 g/dL (ref 6.5–8.1)

## 2019-06-20 LAB — BRAIN NATRIURETIC PEPTIDE: B Natriuretic Peptide: 21 pg/mL (ref 0.0–100.0)

## 2019-06-20 LAB — D-DIMER, QUANTITATIVE: D-Dimer, Quant: 0.39 ug/mL-FEU (ref 0.00–0.50)

## 2019-06-20 MED ORDER — IOHEXOL 300 MG/ML  SOLN
75.0000 mL | Freq: Once | INTRAMUSCULAR | Status: AC | PRN
  Administered 2019-06-20: 75 mL via INTRAVENOUS

## 2019-06-20 MED ORDER — ALBUTEROL SULFATE HFA 108 (90 BASE) MCG/ACT IN AERS
4.0000 | INHALATION_SPRAY | RESPIRATORY_TRACT | Status: DC | PRN
  Administered 2019-06-20: 4 via RESPIRATORY_TRACT
  Filled 2019-06-20: qty 6.7

## 2019-06-20 NOTE — ED Triage Notes (Signed)
Patient complains of shortness of breath x 1 week. States he has been taking prednisone x 5 days with no relief. Patient states shortness of breath has gotten worse over past 3 days.   Patient has had a POSITIVE COVID test.

## 2019-06-20 NOTE — ED Notes (Signed)
ED Provider at bedside. 

## 2019-06-20 NOTE — ED Provider Notes (Signed)
[facility] EMERGENCY DEPARTMENT Provider Note   CSN: [id number] Arrival date & time: 06/20/19  1023     History   Chief Complaint Chief Complaint  Patient presents with  . Shortness of Breath    HPI Roy Fuentes is a 48 y.o. male.     Patient complains of some shortness of breath.  He has a history of positive COVID.  The history is provided by the patient. No language interpreter was used.  Shortness of Breath Severity:  Mild Onset quality:  Sudden Timing:  Constant Progression:  Worsening Chronicity:  Recurrent Context: not activity   Relieved by:  Nothing Worsened by:  Nothing Ineffective treatments:  None tried Associated symptoms: no abdominal pain, no chest pain, no cough, no headaches and no rash     Past Medical History:  Diagnosis Date  . GERD (gastroesophageal reflux disease)   . Kidney stones   . Sleep apnea     There are no active problems to display for this patient.   Past Surgical History:  Procedure Laterality Date  . CARPAL TUNNEL RELEASE     Right & Left Hand        Home Medications    Prior to Admission medications   Medication Sig Start Date End Date Taking? Authorizing Provider  Ascorbic Acid (VITAMIN C) 1000 MG tablet Take 1,000 mg by mouth daily.   Yes [provider]  cetirizine (ZYRTEC) 10 MG chewable tablet Chew 10 mg by mouth daily at 10 pm.   Yes [provider]  cholecalciferol (VITAMIN D3) 25 MCG (1000 UT) tablet Take 1,000 Units by mouth daily.   Yes [provider]  cyanocobalamin 100 MCG tablet Take 100 mcg by mouth daily.   Yes [provider]  Multiple Vitamin (MULTIVITAMIN) tablet Take 1 tablet by mouth daily.   Yes [provider]  predniSONE (DELTASONE) 10 MG tablet Take 10 mg by mouth 2 (two) times daily with a meal.   Yes [provider]  tamsulosin (FLOMAX) 0.4 MG CAPS capsule Take 0.4 mg by mouth daily. 06/08/19  Yes [provider]  amLODipine  (NORVASC) 2.5 MG tablet Take 1 tablet (2.5 mg total) by mouth daily. Patient not taking: Reported on 06/20/2019 03/24/19 06/22/19  Arnoldo Lenis, MD  carisoprodol (SOMA) 350 MG tablet Take 350 mg by mouth at bedtime. 06/08/19   [provider]  dexamethasone (DECADRON) 1 MG tablet Take 3 tablets by mouth 2 (two) times daily. For 7 days 06/08/19   [provider]    Family History Family History  Problem Relation Age of Onset  . Colon polyps Father   . Crohn's disease Father   . Heart disease Father   . Irritable bowel syndrome Father   . Kidney disease Father   . Hypertension Father   . Heart failure Mother   . Atrial fibrillation Mother   . Diabetes Paternal Grandmother   . Colon cancer Neg Hx   . Esophageal cancer Neg Hx     Social History Social History   Tobacco Use  . Smoking status: Never Smoker  . Smokeless tobacco: Never Used  Substance Use Topics  . Alcohol use: No    Alcohol/week: 0.0 standard drinks  . Drug use: No     Allergies   Codeine   Review of Systems Review of Systems  Constitutional: Negative for appetite change and fatigue.  HENT: Negative for congestion, ear discharge and sinus pressure.   Eyes: Negative for discharge.  Respiratory: Positive for shortness of breath. Negative for cough.   Cardiovascular: Negative for chest pain.  Gastrointestinal: Negative for abdominal pain and diarrhea.  Genitourinary: Negative for frequency and hematuria.  Musculoskeletal: Negative for back pain.  Skin: Negative for rash.  Neurological: Negative for seizures and headaches.  Psychiatric/Behavioral: Negative for hallucinations.     Physical Exam Updated Vital Signs BP 116/68   Pulse (!) 47   Temp 98.8 F (37.1 C) (Oral)   Resp (!) 9   Ht 5\' 7"  (1.702 m)   Wt 131.5 kg   SpO2 100%   BMI 45.42 kg/m   Physical Exam Vitals signs and nursing note reviewed.  Constitutional:      Appearance: He is well-developed.  HENT:     Head:  Normocephalic.     Nose: Nose normal.  Eyes:     General: No scleral icterus.    Conjunctiva/sclera: Conjunctivae normal.     Pupils: Pupils are equal, round, and reactive to light.  Neck:     Musculoskeletal: Neck supple.     Thyroid: No thyromegaly.  Cardiovascular:     Rate and Rhythm: Regular rhythm.     Heart sounds: No murmur. No friction rub. No gallop.      Comments: Bradycardia Pulmonary:     Breath sounds: No stridor. No wheezing or rales.  Chest:     Chest wall: No tenderness.  Abdominal:     General: There is no distension.     Tenderness: There is no abdominal tenderness. There is no rebound.  Musculoskeletal: Normal range of motion.  Lymphadenopathy:     Cervical: No cervical adenopathy.  Skin:    Findings: No erythema or rash.  Neurological:     Mental Status: He is alert and oriented to person, place, and time.     Motor: No abnormal muscle tone.     Coordination: Coordination normal.  Psychiatric:        Behavior: Behavior normal.      ED Treatments / Results  Labs (all labs ordered are listed, but only abnormal results are displayed) Labs Reviewed  CBC WITH DIFFERENTIAL/PLATELET - Abnormal; Notable for the following components:      Result Value   WBC 15.0 (*)    Neutro Abs 11.1 (*)    Abs Immature Granulocytes 1.00 (*)    All other components within normal limits  COMPREHENSIVE METABOLIC PANEL - Abnormal; Notable for the following components:   ALT 84 (*)    All other components within normal limits  D-DIMER, QUANTITATIVE (NOT AT [facility])  BRAIN NATRIURETIC PEPTIDE  TROPONIN I (HIGH SENSITIVITY)  TROPONIN I (HIGH SENSITIVITY)    EKG None  Radiology Ct Chest W Contrast  Result Date: 06/20/2019 CLINICAL DATA:  48 year old presenting with a 1 week history of shortness of breath that has not improved after a 5 day course of prednisone. In fact the shortness of breath has significantly worsened over the past 3 days. EXAM: CT CHEST WITH CONTRAST  TECHNIQUE: Multidetector CT imaging of the chest was performed during intravenous contrast administration. CONTRAST:  24mL OMNIPAQUE IOHEXOL 300 MG/ML IV. COMPARISON:  None. FINDINGS: Cardiovascular: Heart size upper normal. No visible coronary atherosclerosis. No pericardial effusion. No visible atherosclerosis involving the thoracic or proximal abdominal aorta or their visualized branches. Central pulmonary arteries patent. Mediastinum/Nodes: No pathologically enlarged mediastinal, hilar or axillary lymph nodes. No mediastinal masses. Normal-appearing esophagus. Normal-appearing thyroid gland. Lungs/Pleura: Minimal linear scar or atelectasis in the lower lobes. Lung parenchyma otherwise clear.  No confluent or ground-glass airspace consolidation. No parenchymal nodules or masses. No evidence of interstitial lung disease. No pleural effusions. Central airways patent without significant bronchial wall thickening. Upper Abdomen: Unremarkable for the early portal venous phase of enhancement. 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 IMPRESSION: 1. No acute cardiopulmonary disease. 2. Minimal linear scar or atelectasis in the lower lobes. Electronically Signed   By: Evangeline Dakin M.D.   On: 06/20/2019 14:28   Dg Chest Portable 1 View  Result Date: 06/20/2019 CLINICAL DATA:  Shortness of breath. EXAM: PORTABLE CHEST 1 VIEW COMPARISON:  None. FINDINGS: The heart size and mediastinal contours are within normal limits. Both lungs are clear. The visualized skeletal structures are unremarkable. IMPRESSION: Normal exam. Electronically Signed   By: Lorriane Shire M.D.   On: 06/20/2019 12:44    Procedures Procedures (including critical care time)  Medications Ordered in ED Medications  albuterol (VENTOLIN HFA) 108 (90 Base) MCG/ACT inhaler 4 puff (4 puffs Inhalation Given 06/20/19 1213)  iohexol (OMNIPAQUE) 300 MG/ML  solution 75 mL (75 mLs Intravenous Contrast Given 06/20/19 1409)     Initial Impression / Assessment and Plan / ED Course  I have reviewed the triage vital signs and the nursing notes.  Pertinent labs & imaging results that were available during my care of the patient were reviewed by me and considered in my medical decision making    Roy Fuentes was evaluated in Emergency Department on 06/20/2019 for the symptoms described in the history of present illness. He was evaluated in the context of the global COVID-19 pandemic, which necessitated consideration that the patient might be at risk for infection with the SARS-CoV-2 virus that causes COVID-19. Institutional protocols and algorithms that pertain to the evaluation of patients at risk for COVID-19 are in a state of rapid change based on information released by regulatory bodies including the CDC and federal and state organizations. These policies and algorithms were followed during the patient's care in the ED.    Patient will use albuterol heroin follow-up with his PCP    Final Clinical Impressions(s) / ED Diagnoses   Final diagnoses:  COVID-19 virus infection    ED Discharge Orders    None       Milton Ferguson, MD 06/20/19 1458

## 2019-06-20 NOTE — Discharge Instructions (Addendum)
Use the albuterol inhaler 2 puffs 4 times a day if needed for shortness of breath.  Follow-up with your regular doctor later this week for recheck

## 2019-06-22 ENCOUNTER — Telehealth: Payer: Self-pay | Admitting: Cardiology

## 2019-06-22 DIAGNOSIS — R001 Bradycardia, unspecified: Secondary | ICD-10-CM

## 2019-06-22 NOTE — Telephone Encounter (Signed)
Returned call to pt's wife. She states that he has had COVID for about 5 weeks. She states that over the past few days his heart rates have dropped into the low 40's and he is feeling like he is going to pass out at times. She states that she is having him drink a lot of caffeine to stimulate his heart rate as it will go up some. When he is up walking around his heart rate will go in the low 50's, but he gets  dizzy quickly. She is wanting to schedule an echo to see if having COVID had an effect on the pt's heart. Please advise.

## 2019-06-22 NOTE — Telephone Encounter (Signed)
Patient is currently in quarantine due to [place]. Recently in [place] this past Tuesday.   She is worried about his EF  Michela Pitcher that his hear-rate 45  Usually runs 53-55.

## 2019-06-22 NOTE — Telephone Encounter (Signed)
Where and when was his covid test, I don't see in our system. COVID can effect his EF, but that would not cause low heart rates but instead would cause heart failure with fluid building up in the body and lungs. The testing he had in the ER a few days ago showed no evidence of heart failure, so I would not consider checking an echo. He has had low hearts for some time now, unclear if they may have worsened and contributing to his symptoms. To look closer at that I would recommend a 48 hr holter for bradycardia be mailed to the house. Has he followed up with his pcp for his covid diagnosis    Zandra Abts MD

## 2019-06-22 NOTE — Telephone Encounter (Addendum)
Called pt. No answer, voicemail box is full. Unable to leave message. Placed order for 48 hr. event monitor, and put it in preventice.

## 2019-06-23 NOTE — Telephone Encounter (Signed)
Can we have a 48 hr holter mailed out to him for bradycardia   Zandra Abts MD

## 2019-06-23 NOTE — Telephone Encounter (Signed)
PT had his COVID testing done at [facility] on Aug 23rd. He did not believe the result (Positive)so he went back on Aug 27th to [facility] and had another and it was positive as well.

## 2019-06-26 NOTE — Telephone Encounter (Signed)
S/w wife she states that she works at [employer] and there is not prior authorization needed so she states that she does not know why it is taking so long to have this monitor mailed to pt. Please call pt to advise status of mailing out the monitor. Please advise.

## 2019-06-26 NOTE — Telephone Encounter (Signed)
Patient calling to check on the status of this .  They have not heard from anyone from company about monitor Stated their insurance told them all should be good to receive it

## 2019-06-28 NOTE — Telephone Encounter (Signed)
Checked [employer] website. Monitor should be delivered by 9PM today by [employer]. I sent pt a MyChart message to inform.

## 2019-07-07 ENCOUNTER — Encounter (INDEPENDENT_AMBULATORY_CARE_PROVIDER_SITE_OTHER): Payer: Managed Care, Other (non HMO)

## 2019-07-07 DIAGNOSIS — R001 Bradycardia, unspecified: Secondary | ICD-10-CM | POA: Diagnosis not present

## 2019-07-19 ENCOUNTER — Telehealth: Payer: Self-pay | Admitting: *Deleted

## 2019-07-19 NOTE — Telephone Encounter (Signed)
-----   Message from Arnoldo Lenis, MD sent at 07/18/2019  2:52 PM EDT ----- NOrmal heart monitor, just some occasional extra heart beats at times which is common and benign. NO significnat low heart rates. Are they still seeing low rates with there home monitor?    Zandra Abts MD

## 2019-07-26 NOTE — Telephone Encounter (Signed)
Pt voiced understanding - says he has not had any more lower HR with home monitor since recovering from COVID 19 - routed to pcp

## 2019-09-06 ENCOUNTER — Encounter: Payer: Self-pay | Admitting: Internal Medicine

## 2019-09-13 ENCOUNTER — Encounter: Payer: Self-pay | Admitting: Internal Medicine

## 2019-09-14 ENCOUNTER — Encounter: Payer: Self-pay | Admitting: Internal Medicine

## 2019-09-14 ENCOUNTER — Ambulatory Visit (AMBULATORY_SURGERY_CENTER): Payer: Managed Care, Other (non HMO) | Admitting: *Deleted

## 2019-09-14 ENCOUNTER — Other Ambulatory Visit: Payer: Self-pay

## 2019-09-14 VITALS — Ht 67.0 in | Wt 285.0 lb

## 2019-09-14 DIAGNOSIS — Z86010 Personal history of colonic polyps: Secondary | ICD-10-CM

## 2019-09-14 DIAGNOSIS — Z1159 Encounter for screening for other viral diseases: Secondary | ICD-10-CM

## 2019-09-14 MED ORDER — SUPREP BOWEL PREP KIT 17.5-3.13-1.6 GM/177ML PO SOLN: 1.0000 | Freq: Once | ORAL | 0 refills | Status: AC

## 2019-09-14 NOTE — Progress Notes (Signed)
Pt verified name, DOB, address and insurance during PV today. Pt mailed instruction packet to included paper to complete and mail back to [facility] with addressed and stamped envelope, Emmi video, copy of consent form to read and not return, and instructions. [employer]  coupon mailed in packet. PV completed over the phone. Pt encouraged to call with questions or issues   No egg or soy allergy known to patient  No issues with past sedation with any surgeries  or procedures, no intubation problems  No diet pills per patient No home 02 use per patient  No blood thinners per patient  Pt denies issues with constipation  No A fib or A flutter  EMMI video sent to pt's e mail   Due to the COVID-19 pandemic we are asking patients to follow these guidelines. Please only bring one care partner. Please be aware that your care partner may wait in the car in the parking lot or if they feel like they will be too hot to wait in the car, they may wait in the lobby on the 4th floor. All care partners are required to wear a mask the entire time (we do not have any that we can provide them), they need to practice social distancing, and we will do a Covid check for all patient's and care partners when you arrive. Also we will check their temperature and your temperature. If the care partner waits in their car they need to stay in the parking lot the entire time and we will call them on their cell phone when the patient is ready for discharge so they can bring the car to the front of the building. Also all patient's will need to wear a mask into building.

## 2019-09-19 ENCOUNTER — Other Ambulatory Visit (HOSPITAL_COMMUNITY)
Admission: RE | Admit: 2019-09-19 | Discharge: 2019-09-19 | Disposition: A | Discharge status: Home / Self Care | Payer: Managed Care, Other (non HMO) | Source: Ambulatory Visit | Attending: Internal Medicine | Admitting: Internal Medicine

## 2019-09-19 ENCOUNTER — Other Ambulatory Visit: Payer: Self-pay

## 2019-09-19 DIAGNOSIS — Z20828 Contact with and (suspected) exposure to other viral communicable diseases: Secondary | ICD-10-CM | POA: Diagnosis not present

## 2019-09-19 DIAGNOSIS — Z01812 Encounter for preprocedural laboratory examination: Secondary | ICD-10-CM | POA: Insufficient documentation

## 2019-09-19 LAB — SARS CORONAVIRUS 2 (TAT 6-24 HRS): SARS Coronavirus 2: NEGATIVE

## 2019-09-22 ENCOUNTER — Ambulatory Visit (AMBULATORY_SURGERY_CENTER): Payer: Managed Care, Other (non HMO) | Admitting: Internal Medicine

## 2019-09-22 ENCOUNTER — Encounter: Payer: Self-pay | Admitting: Internal Medicine

## 2019-09-22 ENCOUNTER — Other Ambulatory Visit: Payer: Self-pay

## 2019-09-22 VITALS — BP 104/54 | HR 52 | Temp 97.7°F | Resp 20 | Ht 67.0 in | Wt 301.0 lb

## 2019-09-22 DIAGNOSIS — Z86010 Personal history of colonic polyps: Secondary | ICD-10-CM

## 2019-09-22 MED ORDER — SODIUM CHLORIDE 0.9 % IV SOLN: 500.0000 mL | Freq: Once | INTRAVENOUS | Status: DC

## 2019-09-22 NOTE — Progress Notes (Signed)
A and O x3. Report to RN. Tolerated MAC anesthesia well.

## 2019-09-22 NOTE — Progress Notes (Signed)
Vs-[place] Temp-LC  Pt's states no medical or surgical changes since previsit or office visit.

## 2019-09-22 NOTE — Patient Instructions (Addendum)
Read all handouts given to you by your recovery room nurse, please.  YOU HAD AN ENDOSCOPIC PROCEDURE TODAY AT [facility]:   Refer to the procedure report that was given to you for any specific questions about what was found during the examination.  If the procedure report does not answer your questions, please call your gastroenterologist to clarify.  If you requested that your care partner not be given the details of your procedure findings, then the procedure report has been included in a sealed envelope for you to review at your convenience later.  YOU SHOULD EXPECT: Some feelings of bloating in the abdomen. Passage of more gas than usual.  Walking can help get rid of the air that was put into your GI tract during the procedure and reduce the bloating. If you had a lower endoscopy (such as a colonoscopy or flexible sigmoidoscopy) you may notice spotting of blood in your stool or on the toilet paper. If you underwent a bowel prep for your procedure, you may not have a normal bowel movement for a few days.  Please Note:  You might notice some irritation and congestion in your nose or some drainage.  This is from the oxygen used during your procedure.  There is no need for concern and it should clear up in a day or so.  SYMPTOMS TO REPORT IMMEDIATELY:   Following lower endoscopy (colonoscopy or flexible sigmoidoscopy):  Excessive amounts of blood in the stool  Significant tenderness or worsening of abdominal pains  Swelling of the abdomen that is new, acute  Fever of 100F or higher   For urgent or emergent issues, a gastroenterologist can be reached at any hour by calling [phone].   DIET:  We do recommend a small meal at first, but then you may proceed to your regular diet.  Drink plenty of fluids but you should avoid alcoholic beverages for 24 hours. Try to increase the fiber in your diet, and drink plenty of water.  ACTIVITY:  You should plan to take it easy for  the rest of today and you should NOT DRIVE or use heavy machinery until tomorrow (because of the sedation medicines used during the test).    FOLLOW UP: Our staff will call the number listed on your records 48-72 hours following your procedure to check on you and address any questions or concerns that you may have regarding the information given to you following your procedure. If we do not reach you, we will leave a message.  We will attempt to reach you two times.  During this call, we will ask if you have developed any symptoms of COVID 19. If you develop any symptoms (ie: fever, flu-like symptoms, shortness of breath, cough etc.) before then, please call [phone].  If you test positive for Covid 19 in the 2 weeks post procedure, please call and report this information to [place].     SIGNATURES/CONFIDENTIALITY: You and/or your care partner have signed paperwork which will be entered into your electronic medical record.  These signatures attest to the fact that that the information above on your After Visit Summary has been reviewed and is understood.  Full responsibility of the confidentiality of this discharge information lies with you and/or your care-partner.

## 2019-09-22 NOTE — Op Note (Signed)
[facility] Patient Name: Roy Fuentes Procedure Date: 09/22/2019 11:35 AM MRN: [id number] Endoscopist: Docia Chuck. Henrene Pastor , MD Age: 48 Referring MD:  Date of Birth: 01/06/1971 Gender: Male Account #: [id number] Procedure:                Colonoscopy Indications:              High risk colon cancer surveillance: Personal                            history of non-advanced adenoma. Index examination                            November 2015 Medicines:                Monitored Anesthesia Care Procedure:                Pre-Anesthesia Assessment:                           - Prior to the procedure, a History and Physical                            was performed, and patient medications and                            allergies were reviewed. The patient's tolerance of                            previous anesthesia was also reviewed. The risks                            and benefits of the procedure and the sedation                            options and risks were discussed with the patient.                            All questions were answered, and informed consent                            was obtained. Prior Anticoagulants: The patient has                            taken no previous anticoagulant or antiplatelet                            agents. ASA Grade Assessment: II - A patient with                            mild systemic disease. After reviewing the risks                            and benefits, the patient was deemed in  satisfactory condition to undergo the procedure.                           After obtaining informed consent, the colonoscope                            was passed under direct vision. Throughout the                            procedure, the patient's blood pressure, pulse, and                            oxygen saturations were monitored continuously. The                            Colonoscope was introduced through the anus and                         advanced to the the cecum, identified by                            appendiceal orifice and ileocecal valve. The                            ileocecal valve, appendiceal orifice, and rectum                            were photographed. The quality of the bowel                            preparation was excellent. The colonoscopy was                            performed without difficulty. The patient tolerated                            the procedure well. The bowel preparation used was                            SUPREP via split dose instruction. Scope In: 11:47:02 AM Scope Out: 12:01:21 PM Scope Withdrawal Time: 0 hours 10 minutes 33 seconds  Total Procedure Duration: 0 hours 14 minutes 19 seconds  Findings:                 Multiple diverticula were found in the sigmoid                            colon.                           The exam was otherwise without abnormality on                            direct and retroflexion views. Complications:            No immediate complications. Estimated blood loss:  None. Estimated Blood Loss:     Estimated blood loss: none. Impression:               - Diverticulosis in the sigmoid colon.                           - The examination was otherwise normal on direct                            and retroflexion views.                           - No specimens collected. Recommendation:           - Repeat colonoscopy in 10 years for surveillance.                           - Patient has a contact number available for                            emergencies. The signs and symptoms of potential                            delayed complications were discussed with the                            patient. Return to normal activities tomorrow.                            Written discharge instructions were provided to the                            patient.                           - Resume previous diet.                            - Continue present medications. Docia Chuck. Henrene Pastor, MD 09/22/2019 12:19:34 PM This report has been signed electronically.

## 2019-09-26 ENCOUNTER — Telehealth: Payer: Self-pay

## 2019-09-26 NOTE — Telephone Encounter (Signed)
  Follow up Call-  Call back number 09/22/2019  Post procedure Call Back phone  # [phone]  Permission to leave phone message Yes  Some recent data might be hidden     Patient questions:  Do you have a fever, pain , or abdominal swelling? No. Pain Score  0 *  Have you tolerated food without any problems? Yes.    Have you been able to return to your normal activities? Yes.    Do you have any questions about your discharge instructions: Diet   No. Medications  No. Follow up visit  No.  Do you have questions or concerns about your Care? No.  Actions: * If pain score is 4 or above: No action needed, pain <4.  1. Have you developed a fever since your procedure? no  2.   Have you had an respiratory symptoms (SOB or cough) since your procedure? no  3.   Have you tested positive for COVID 19 since your procedure no  4.   Have you had any family members/close contacts diagnosed with the COVID 19 since your procedure?  no   If yes to any of these questions please route to Joylene Khalon, RN and Alphonsa Gin, Therapist, sports.

## 2020-04-05 ENCOUNTER — Other Ambulatory Visit: Payer: Self-pay | Admitting: *Deleted

## 2020-04-05 MED ORDER — AMLODIPINE BESYLATE 2.5 MG PO TABS: 2.5000 mg | ORAL_TABLET | Freq: Every day | ORAL | 0 refills | Status: DC

## 2020-07-26 ENCOUNTER — Ambulatory Visit (INDEPENDENT_AMBULATORY_CARE_PROVIDER_SITE_OTHER): Payer: Managed Care, Other (non HMO) | Admitting: Cardiology

## 2020-07-26 ENCOUNTER — Encounter: Payer: Self-pay | Admitting: Cardiology

## 2020-07-26 ENCOUNTER — Other Ambulatory Visit: Payer: Self-pay

## 2020-07-26 VITALS — BP 130/76 | HR 62 | Ht 67.0 in | Wt 313.4 lb

## 2020-07-26 DIAGNOSIS — I10 Essential (primary) hypertension: Secondary | ICD-10-CM

## 2020-07-26 MED ORDER — AMLODIPINE BESYLATE 2.5 MG PO TABS: 2.5000 mg | ORAL_TABLET | Freq: Every day | ORAL | 3 refills | Status: DC

## 2020-07-26 NOTE — Patient Instructions (Signed)

## 2020-07-26 NOTE — Progress Notes (Signed)
Clinical Summary Roy Fuentes is a 49 y.o.male  seen today for follow up of the following medical problems.   1.Chest pain - cath 2003: no significant CAD  - no recent chest pains.    2. Bradycardia - reports long history of chronic low heart rates. -denies any significant symptoms  - no recent issues.   3.HTN  - taking norvasc 5mg  in AM causes significant fatigue. Taking 2.5 mg in evening, tolerating well.  -compliant with meds  4. OSA - monitored by Dr Volanda Napoleon, neurologist and seeing.Normal check recently  - compliant with cpap   5. Leg pains - pain at night left calf, cramping like pain - not exertional pains, only happen at night.    SH; has had covid vaccine, had moderna vaccine x2. Previously had covid infection Wife works as Orthoptist for [employer]  Past Medical History:  Diagnosis Date  . Adenomatous polyps   . Allergy   . Bradycardia    occ in the 40's  . GERD (gastroesophageal reflux disease)   . Hypertension   . Kidney stones   . Sleep apnea    wears cpap      Allergies  Allergen Reactions  . Codeine Nausea And Vomiting     Current Outpatient Medications  Medication Sig Dispense Refill  . amLODipine (NORVASC) 2.5 MG tablet Take 1 tablet (2.5 mg total) by mouth daily. 90 tablet 0  . Ascorbic Acid (VITAMIN C) 1000 MG tablet Take 1,000 mg by mouth daily.    . carisoprodol (SOMA) 350 MG tablet Take 350 mg by mouth at bedtime.    . cetirizine (ZYRTEC) 10 MG chewable tablet Chew 10 mg by mouth daily at 10 pm.    . cholecalciferol (VITAMIN D3) 25 MCG (1000 UT) tablet Take 1,000 Units by mouth daily.    . cyanocobalamin 100 MCG tablet Take 100 mcg by mouth daily.    Marland Kitchen dexamethasone (DECADRON) 1 MG tablet Take 3 tablets by mouth 2 (two) times daily. For 7 days    . Multiple Vitamin (MULTIVITAMIN) tablet Take 1 tablet by mouth daily.    . predniSONE (DELTASONE) 10 MG tablet Take 10 mg by mouth 2 (two) times daily with a meal.    .  tamsulosin (FLOMAX) 0.4 MG CAPS capsule Take 0.4 mg by mouth daily.     No current facility-administered medications for this visit.     Past Surgical History:  Procedure Laterality Date  . CARPAL TUNNEL RELEASE     Right & Left Hand  . COLONOSCOPY    . POLYPECTOMY    . SHOULDER SURGERY Left    left torn bicep  . WISDOM TOOTH EXTRACTION       Allergies  Allergen Reactions  . Codeine Nausea And Vomiting      Family History  Problem Relation Age of Onset  . Colon polyps Father   . Crohn's disease Father   . Heart disease Father   . Irritable bowel syndrome Father   . Kidney disease Father   . Hypertension Father   . Heart failure Mother   . Atrial fibrillation Mother   . Diabetes Paternal Grandmother   . Colon cancer Maternal Aunt   . Esophageal cancer Neg Hx   . Rectal cancer Neg Hx   . Stomach cancer Neg Hx      Social History Mr. Keep reports that he has never smoked. He has never used smokeless tobacco. Mr. Bostock reports no history of alcohol use.  Review of Systems CONSTITUTIONAL: No weight loss, fever, chills, weakness or fatigue.  HEENT: Eyes: No visual loss, blurred vision, double vision or yellow sclerae.No hearing loss, sneezing, congestion, runny nose or sore throat.  SKIN: No rash or itching.  CARDIOVASCULAR: per hpi RESPIRATORY: No shortness of breath, cough or sputum.  GASTROINTESTINAL: No anorexia, nausea, vomiting or diarrhea. No abdominal pain or blood.  GENITOURINARY: No burning on urination, no polyuria NEUROLOGICAL: No headache, dizziness, syncope, paralysis, ataxia, numbness or tingling in the extremities. No change in bowel or bladder control.  MUSCULOSKELETAL: No muscle, back pain, joint pain or stiffness.  LYMPHATICS: No enlarged nodes. No history of splenectomy.  PSYCHIATRIC: No history of depression or anxiety.  ENDOCRINOLOGIC: No reports of sweating, cold or heat intolerance. No polyuria or polydipsia.  Marland Kitchen   Physical  Examination Today's Vitals   07/26/20 1513  BP: 130/76  Pulse: 62  SpO2: 96%  Weight: (!) 313 lb 6.4 oz (142.2 kg)  Height: 5\' 7"  (1.702 m)   Body mass index is 49.09 kg/m.  Gen: resting comfortably, no acute distress HEENT: no scleral icterus, pupils equal round and reactive, no palptable cervical adenopathy,  CV: RRR, no m/r/g, no jvd Resp: Clear to auscultation bilaterally GI: abdomen is soft, non-tender, non-distended, normal bowel sounds, no hepatosplenomegaly MSK: extremities are warm, no edema.  Skin: warm, no rash Neuro:  no focal deficits Psych: appropriate affect   Diagnostic Studies 06/2019 event monitor  48 hr holter monitor  Min HR 45, Max HR 151, Avg HR 82  Rare supraventricular ectopy, all in the form of PACs  Rare ventricular ectopy, all in the form of isolated PVCs  No symptoms reported  No significant arrhythmias     Assessment and Plan   1. HTN -at goal, continue current meds   2. Bradycardia - mild sinus brady at times has been chronic, essentially his resting heart rate is just low normal to mildly decreased - prior benign cardiac monitor - monitor at this time.   F/u 1 year   Arnoldo Lenis, M.D.,

## 2021-02-28 IMAGING — CT CT CHEST W/ CM
1 of 2 series · 15 of 32 positions shown, 19 images · IV contrast (omnipaque)
Comparison: None.

CLINICAL DATA: 48-year-old presenting with a 1 week history of
shortness of breath that has not improved after a 5 day course of
prednisone. In fact the shortness of breath has significantly
worsened over the past 3 days.

EXAM:
CT CHEST WITH CONTRAST
TECHNIQUE: Multidetector CT imaging of the chest was performed during
intravenous contrast administration.
CONTRAST:  75mL OMNIPAQUE IOHEXOL 300 MG/ML IV.

[Series 2: routine chest with · axial · 0.71mm/px · z∈[-128,+146]mm · 15 of 149 slices shown, 19 images]
[im 6/149  mediastinal]
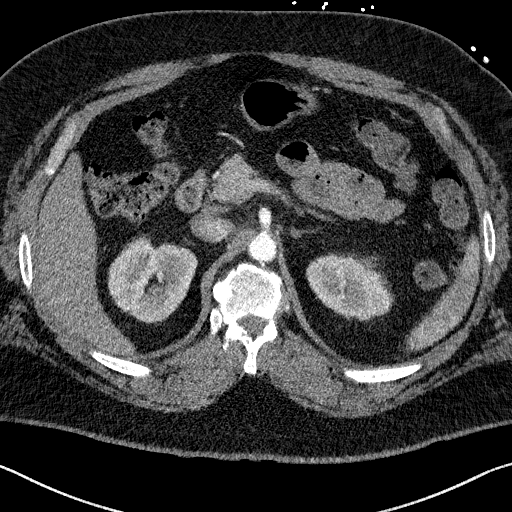
[im 6/149  lung]
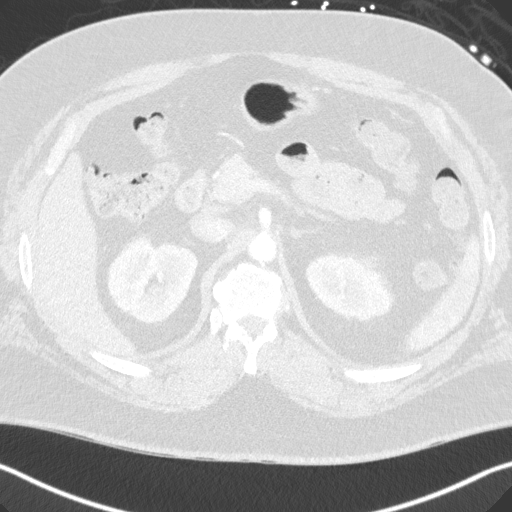
[im 17/149  lung]
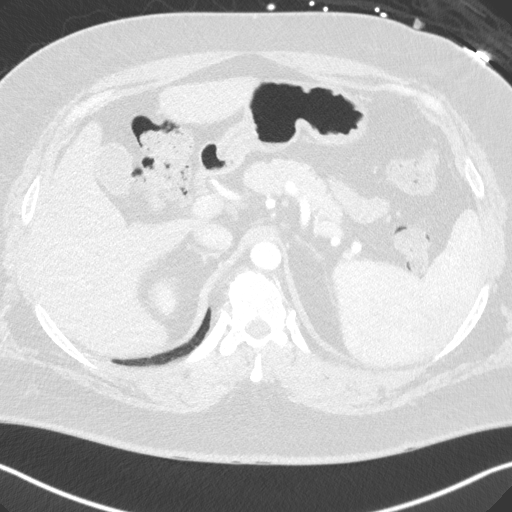
[im 28/149  lung]
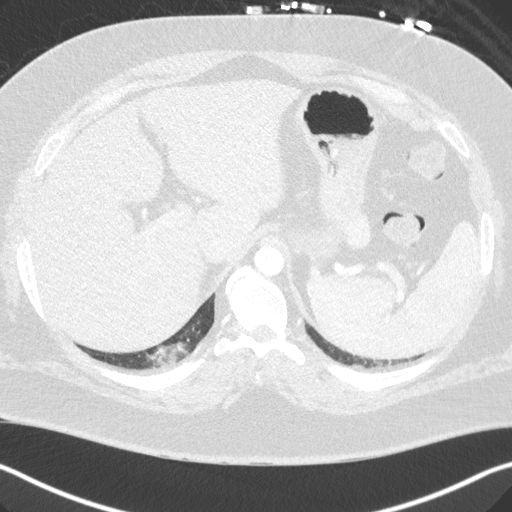
[im 39/149  lung]
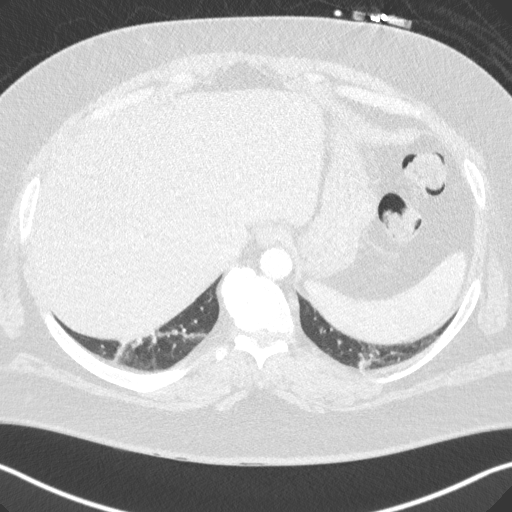
[im 50/149  mediastinal]
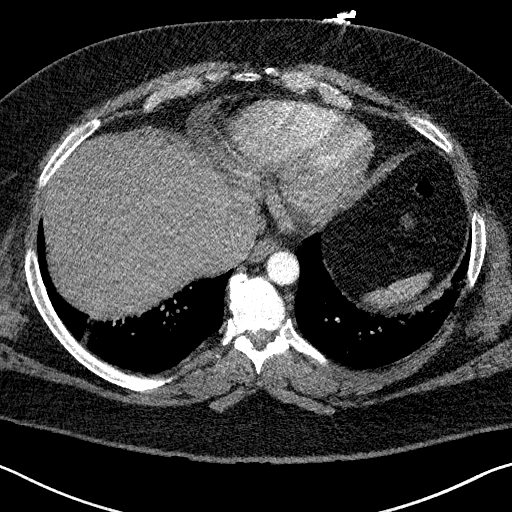
[im 50/149  lung]
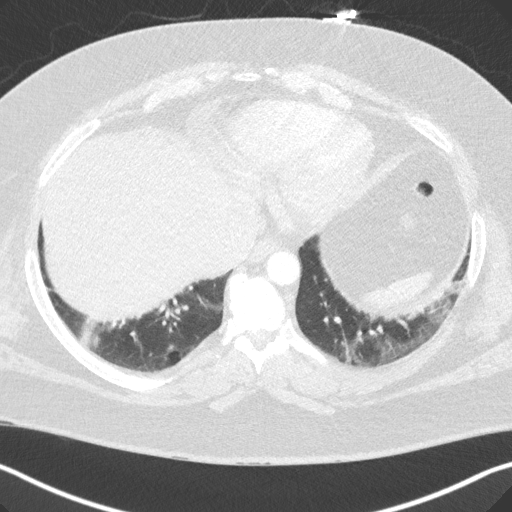
[im 55/149  lung]
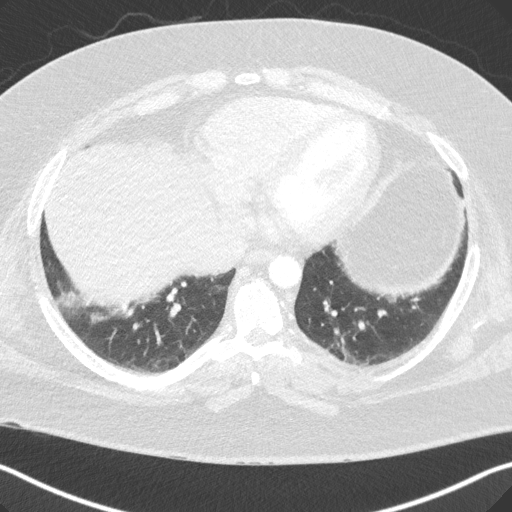
[im 66/149  lung]
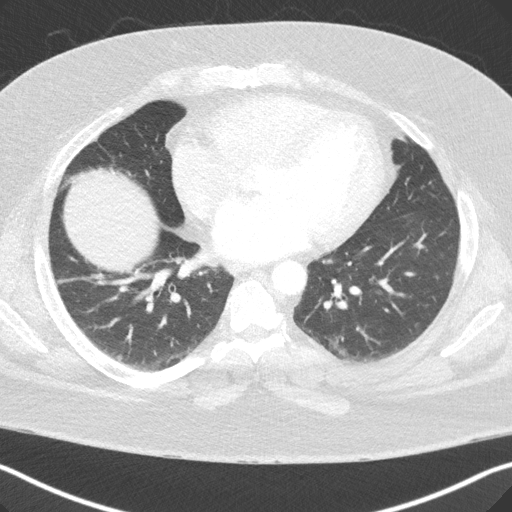
[im 75/149  lung]
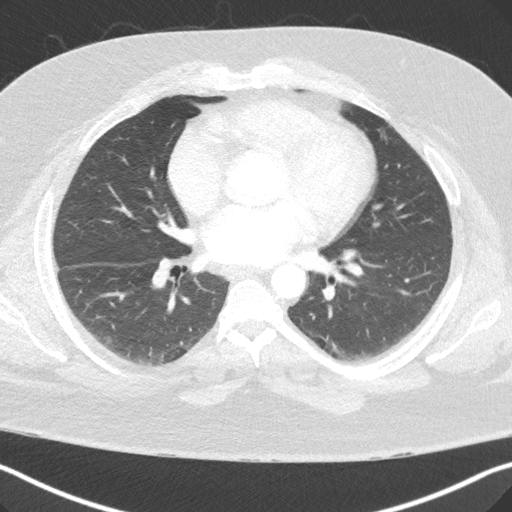
[im 83/149  mediastinal]
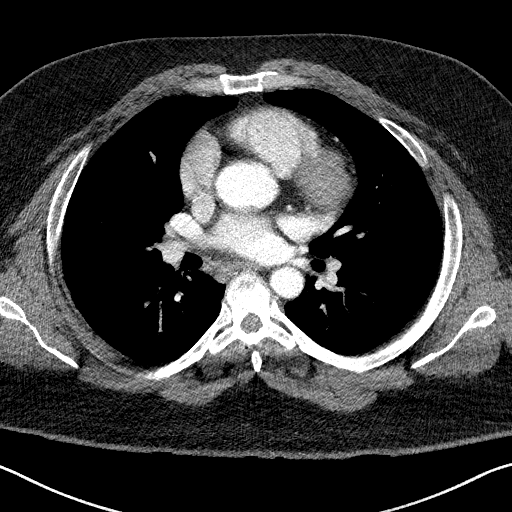
[im 83/149  lung]
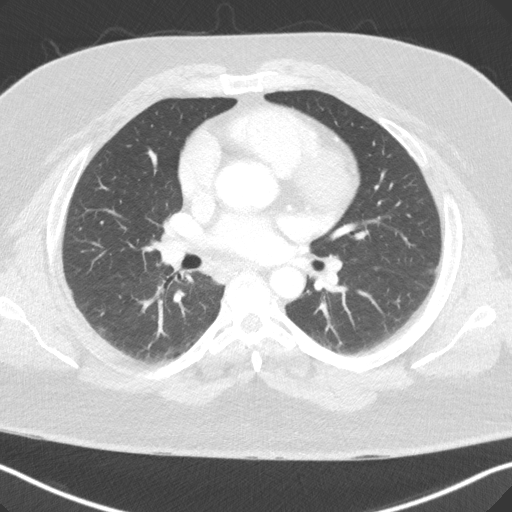
[im 94/149  lung]
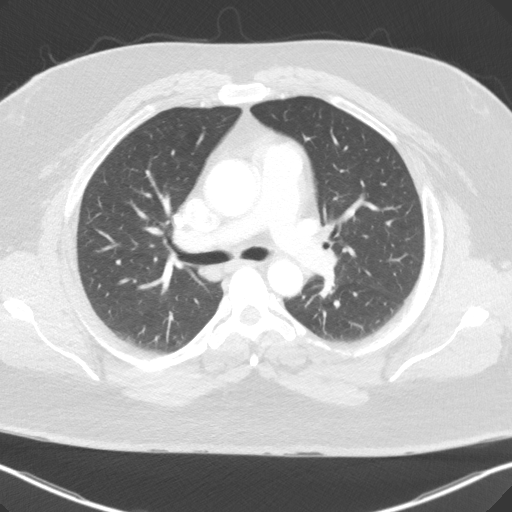
[im 99/149  lung]
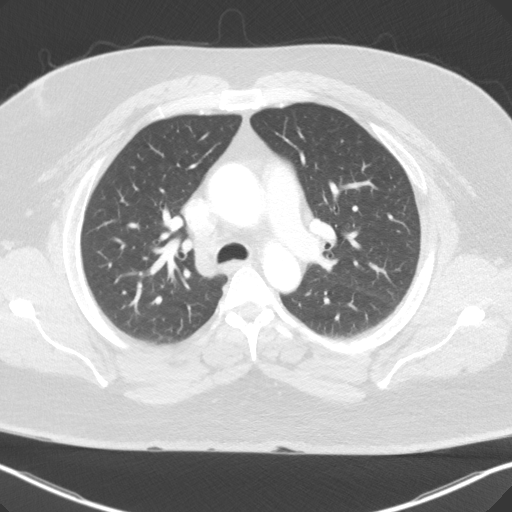
[im 110/149  lung]
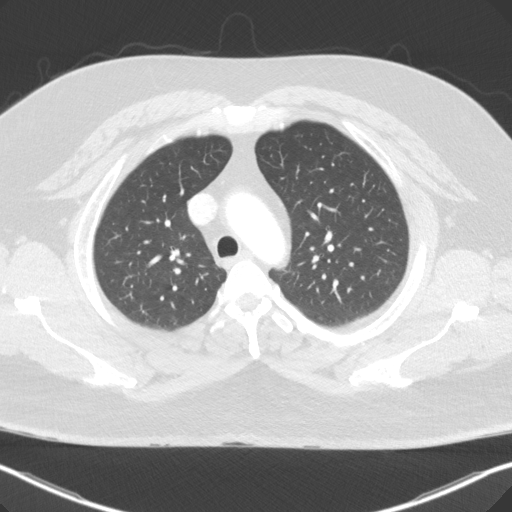
[im 121/149  mediastinal]
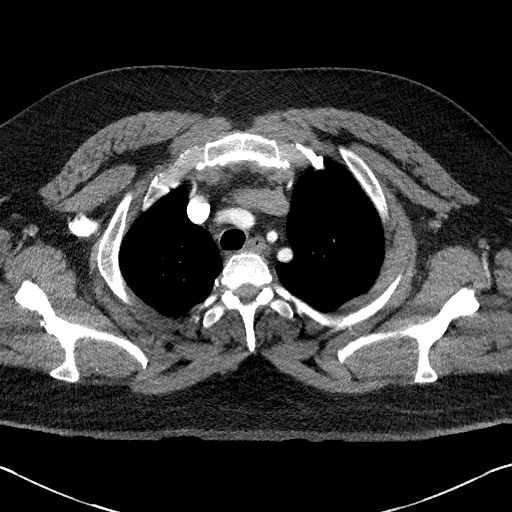
[im 121/149  lung]
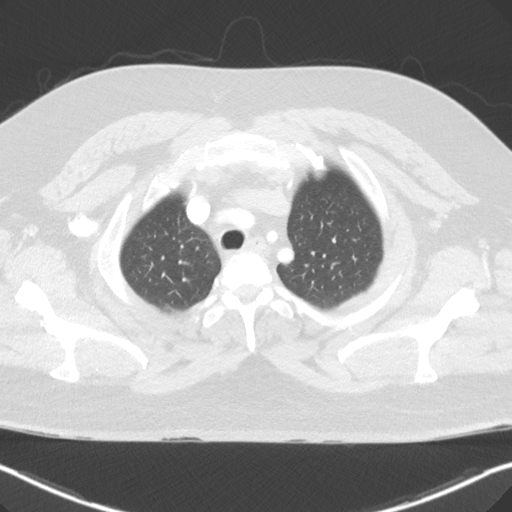
[im 132/149  lung]
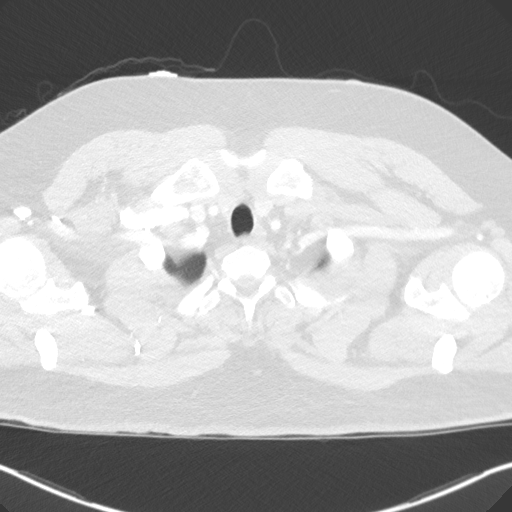
[im 143/149  lung]
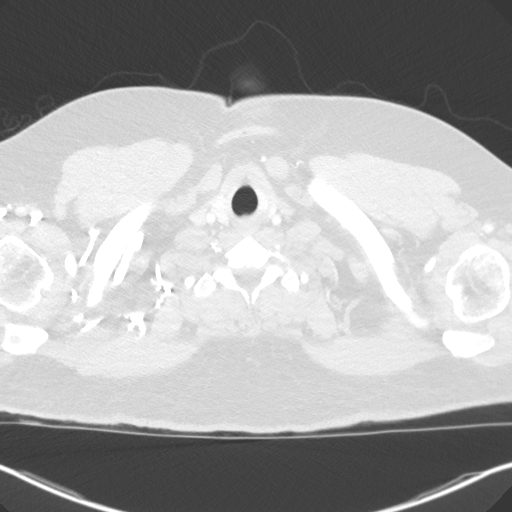

[15 of 32 positions shown; findings below may reference images not displayed]

FINDINGS: Cardiovascular: Heart size upper normal. No visible coronary
atherosclerosis. No pericardial effusion. No visible atherosclerosis
involving the thoracic or proximal abdominal aorta or their
visualized branches. Central pulmonary arteries patent.

Mediastinum/Nodes: No pathologically enlarged mediastinal, hilar or
axillary lymph nodes. No mediastinal masses. Normal-appearing
esophagus. Normal-appearing thyroid gland.

Lungs/Pleura: Minimal linear scar or atelectasis in the lower lobes.
Lung parenchyma otherwise clear. No confluent or ground-glass
airspace consolidation. No parenchymal nodules or masses. No
evidence of interstitial lung disease. No pleural effusions. Central
airways patent without significant bronchial wall thickening.

Upper Abdomen: Unremarkable for the early portal venous phase of
enhancement.

Musculoskeletal: Degenerative disc disease and spondylosis involving
the mid and lower thoracic spine and at the visualized C6-7 level.
IMPRESSION: 1. No acute cardiopulmonary disease.
2. Minimal linear scar or atelectasis in the lower lobes.

## 2022-01-23 ENCOUNTER — Encounter: Payer: Self-pay | Admitting: Cardiology

## 2022-01-23 ENCOUNTER — Ambulatory Visit (INDEPENDENT_AMBULATORY_CARE_PROVIDER_SITE_OTHER): Payer: Managed Care, Other (non HMO) | Admitting: Cardiology

## 2022-01-23 VITALS — BP 118/74 | HR 58 | Ht 67.0 in | Wt 305.4 lb

## 2022-01-23 DIAGNOSIS — R001 Bradycardia, unspecified: Secondary | ICD-10-CM

## 2022-01-23 DIAGNOSIS — I10 Essential (primary) hypertension: Secondary | ICD-10-CM

## 2022-01-23 NOTE — Progress Notes (Signed)
? ? ? ?Clinical Summary ?Roy Fuentes is a 51 y.o.male seen today for follow up of the following medical problems.  ?  ?  ?1. Chest pain ?- cath 2003: no significant CAD ?- no recent symptoms ?  ?  ?2. Bradycardia ?- reports long history of chronic low heart rates. ?- denies any significant symptoms ?  ?- 06/2019 48 hr holter monitor: min HR 45, Max HR 151, Avg HR 82 Rare ectopy.  ? ?- isolated episode of palpitatoins and dizziness 3 month ago.  ? ?  ?3. HTN ?  ?- taking norvasc '5mg'$  in AM causes significant fatigue. Taking 2.5 mg in evening, tolerating well.  ?- he is compliwnt with meds ? ?4. OSA ?- monitored by Dr Volanda Napoleon, neurologist Normal check recently ?  ?- compliant with cpap, has new cpap machine.  ?  ?  ? ?  ?Past Medical History:  ?Diagnosis Date  ? Adenomatous polyps   ? Allergy   ? Bradycardia   ? occ in the 40's  ? GERD (gastroesophageal reflux disease)   ? Hypertension   ? Kidney stones   ? Sleep apnea   ? wears cpap   ? ? ? ?Allergies  ?Allergen Reactions  ? Codeine Nausea And Vomiting  ? ? ? ?Current Outpatient Medications  ?Medication Sig Dispense Refill  ? amLODipine (NORVASC) 2.5 MG tablet Take 1 tablet (2.5 mg total) by mouth daily. 90 tablet 3  ? Ascorbic Acid (VITAMIN C) 1000 MG tablet Take 1,000 mg by mouth daily.    ? cetirizine (ZYRTEC) 10 MG chewable tablet Chew 10 mg by mouth daily at 10 pm.    ? cholecalciferol (VITAMIN D3) 25 MCG (1000 UT) tablet Take 1,000 Units by mouth daily.    ? cyanocobalamin 100 MCG tablet Take 100 mcg by mouth daily.    ? Multiple Vitamin (MULTIVITAMIN) tablet Take 1 tablet by mouth daily.    ? tamsulosin (FLOMAX) 0.4 MG CAPS capsule Take 0.4 mg by mouth daily.    ? ?No current facility-administered medications for this visit.  ? ? ? ?Past Surgical History:  ?Procedure Laterality Date  ? CARPAL TUNNEL RELEASE    ? Right & Left Hand  ? COLONOSCOPY    ? POLYPECTOMY    ? SHOULDER SURGERY Left   ? left torn bicep  ? WISDOM TOOTH EXTRACTION    ? ? ? ?Allergies  ?Allergen  Reactions  ? Codeine Nausea And Vomiting  ? ? ? ? ?Family History  ?Problem Relation Age of Onset  ? Colon polyps Father   ? Crohn's disease Father   ? Heart disease Father   ? Irritable bowel syndrome Father   ? Kidney disease Father   ? Hypertension Father   ? Heart failure Mother   ? Atrial fibrillation Mother   ? Diabetes Paternal Grandmother   ? Colon cancer Maternal Aunt   ? Esophageal cancer Neg Hx   ? Rectal cancer Neg Hx   ? Stomach cancer Neg Hx   ? ? ? ?Social History ?Roy Fuentes reports that he has never smoked. He has never used smokeless tobacco. ?Roy Fuentes reports no history of alcohol use. ? ? ?Review of Systems ?CONSTITUTIONAL: No weight loss, fever, chills, weakness or fatigue.  ?HEENT: Eyes: No visual loss, blurred vision, double vision or yellow sclerae.No hearing loss, sneezing, congestion, runny nose or sore throat.  ?SKIN: No rash or itching.  ?CARDIOVASCULAR: per hpi ?RESPIRATORY: No shortness of breath, cough or sputum.  ?GASTROINTESTINAL:  No anorexia, nausea, vomiting or diarrhea. No abdominal pain or blood.  ?GENITOURINARY: No burning on urination, no polyuria ?NEUROLOGICAL: No headache, dizziness, syncope, paralysis, ataxia, numbness or tingling in the extremities. No change in bowel or bladder control.  ?MUSCULOSKELETAL: No muscle, back pain, joint pain or stiffness.  ?LYMPHATICS: No enlarged nodes. No history of splenectomy.  ?PSYCHIATRIC: No history of depression or anxiety.  ?ENDOCRINOLOGIC: No reports of sweating, cold or heat intolerance. No polyuria or polydipsia.  ?. ? ? ?Physical Examination ?Today's Vitals  ? 01/23/22 1603  ?BP: 118/74  ?Pulse: (!) 58  ?SpO2: 97%  ?Weight: (!) 305 lb 6.4 oz (138.5 kg)  ?Height: '5\' 7"'$  (1.702 m)  ? ?Body mass index is 47.83 kg/m?. ? ?Gen: resting comfortably, no acute distress ?HEENT: no scleral icterus, pupils equal round and reactive, no palptable cervical adenopathy,  ?CV: RRR, no m/r/g no jvd ?Resp: Clear to auscultation bilaterally ?GI:  abdomen is soft, non-tender, non-distended, normal bowel sounds, no hepatosplenomegaly ?MSK: extremities are warm, no edema.  ?Skin: warm, no rash ?Neuro:  no focal deficits ?Psych: appropriate affect ? ? ?Diagnostic Studies ? ?06/2019 event monitor ?48 hr holter monitor ?Min HR 45, Max HR 151, Avg HR 82 ?Rare supraventricular ectopy, all in the form of PACs ?Rare ventricular ectopy, all in the form of isolated PVCs ?No symptoms reported ?No significant arrhythmias ? ? ?Assessment and Plan  ?1. HTN ?-bp is at goal, continue current meds ?  ?2. Bradycardia ?- mild sinus brady at times has been chronic, essentially his resting heart rate is just low normal to mildly decreased ?- prior benign cardiac monitor ?- isolated episode of palpitations, prior monitor had some benign ectopy, suspect cause of symptoms. If progression of symptoms may repeat monitor ? ? ?F/u 1 year  ?  ? ? ? ? ? ?Arnoldo Lenis, M.D., ?

## 2022-01-23 NOTE — Patient Instructions (Addendum)

## 2022-08-20 ENCOUNTER — Telehealth: Payer: Self-pay | Admitting: Cardiology

## 2022-08-20 NOTE — Telephone Encounter (Signed)
Patient c/o Palpitations:  High priority if patient c/o lightheadedness, shortness of breath, or chest pain  How long have you had palpitations/irregular HR/ Afib? Are you having the symptoms now? In and out since 6 months ago, not now  Are you currently experiencing lightheadedness, SOB or CP? Gets SOB, but not now  Do you have a history of afib (atrial fibrillation) or irregular heart rhythm? yes  Have you checked your BP or HR? (document readings if available): HR went up to 140-150 during episodes  Are you experiencing any other symptoms? Fatigue during episodes  Patient's wife states that right now the patient is back in rhythm, but he has been having more frequent episodes of afib and they have been lasting longer. She says he had 2-3 episodes this week. She says during his episodes he will feel fatigued, SOB, and will feel like passing out. She says yesterday was his last episode and he said his heart felt like it would go out of his chest.

## 2022-08-20 NOTE — Telephone Encounter (Signed)
Spouse states that she is returning call. Please advise

## 2022-08-20 NOTE — Telephone Encounter (Signed)
Returned call to wife (June) - informed her that I had already spoken with her husband.  States that she was inside the doctors office & couldn't answer at the time.   Spoke with patient - states that he did see his pcp today & he ordered labs.  BP there 117/73 & HR 70.  Did not do EKG in their office.  He states that he has had a previous episode in September that lasted about 20 minutes, but this most recent episode only lasted a few minutes.  States that he has had no formal dx of afib - his watch was reading that.  Heart rate was elevated & felt fluttery.  Has recently lost 36 lbs, was stressed a little yesterday, and only drinks one cup of coffee in the mornings.  States while at [employer] office, everything checked out fine.

## 2022-08-24 NOTE — Telephone Encounter (Signed)
Patient notified and verbalized understanding. 

## 2022-08-24 NOTE — Telephone Encounter (Signed)
If reoccuring episodes let us know and would repeat monitor  J Gean Larose MD

## 2022-11-10 ENCOUNTER — Ambulatory Visit: Payer: Managed Care, Other (non HMO) | Attending: Cardiology | Admitting: Cardiology

## 2022-11-10 ENCOUNTER — Encounter: Payer: Self-pay | Admitting: *Deleted

## 2022-11-10 ENCOUNTER — Encounter: Payer: Self-pay | Admitting: Cardiology

## 2022-11-10 VITALS — BP 106/66 | HR 50 | Ht 67.0 in | Wt 259.8 lb

## 2022-11-10 DIAGNOSIS — R42 Dizziness and giddiness: Secondary | ICD-10-CM

## 2022-11-10 DIAGNOSIS — R001 Bradycardia, unspecified: Secondary | ICD-10-CM | POA: Diagnosis not present

## 2022-11-10 DIAGNOSIS — R002 Palpitations: Secondary | ICD-10-CM

## 2022-11-10 NOTE — Patient Instructions (Addendum)
Medication Instructions:  Stop Norvasc (Amlodipine) Continue all other medications.     Labwork: none  Testing/Procedures: Your physician has requested that you have an exercise tolerance test. For further information please visit [email or web address]. Please also follow instruction sheet, as given.  Office will contact with results via phone, letter or mychart.     Follow-Up: Pending test results   Any Other Special Instructions Will Be Listed Below (If Applicable). Drink 2-3 liters water per day   If you need a refill on your cardiac medications before your next appointment, please call your pharmacy.

## 2022-11-10 NOTE — Progress Notes (Unsigned)
Clinical Summary Roy Fuentes is a 52 y.o.male seen today for follow up of the following medical problems. Focused visit on recent symptoms of presyncope.       1. Bradycardia - reports long history of chronic low heart rates. - denies any significant symptoms   - 06/2019 48 hr holter monitor: min HR 45, Max HR 151, Avg HR 82 Rare ectopy.    - with exercising walking, feels clammy, dizzy. Does not happen in peak exercise, always happens in coold down period. Can get to the point of near syncope.  - denies any chest pains.     Other medical issues not addressed this visit   1. Chest pain - cath 2003: no significant CAD - no recent symptoms   3. HTN   - taking norvasc '5mg'$  in AM causes significant fatigue. Taking 2.5 mg in evening, tolerating well.  - he is compliwnt with meds   4. OSA - monitored by Dr Volanda Napoleon, neurologist Normal check recently   - compliant with cpap, has new cpap machine. Past Medical History:  Diagnosis Date   Adenomatous polyps    Allergy    Bradycardia    occ in the 40's   GERD (gastroesophageal reflux disease)    Hypertension    Kidney stones    Sleep apnea    wears cpap      Allergies  Allergen Reactions   Codeine Nausea And Vomiting     Current Outpatient Medications  Medication Sig Dispense Refill   amLODipine (NORVASC) 2.5 MG tablet Take 1 tablet (2.5 mg total) by mouth daily. 90 tablet 3   Ascorbic Acid (VITAMIN C) 1000 MG tablet Take 1,000 mg by mouth daily.     cetirizine (ZYRTEC) 10 MG chewable tablet Chew 10 mg by mouth 2 (two) times daily.     cholecalciferol (VITAMIN D3) 25 MCG (1000 UT) tablet Take 1,000 Units by mouth daily.     cyanocobalamin 100 MCG tablet Take 100 mcg by mouth daily.     Multiple Vitamin (MULTIVITAMIN) tablet Take 1 tablet by mouth daily.     tamsulosin (FLOMAX) 0.4 MG CAPS capsule Take 0.4 mg by mouth daily.     No current facility-administered medications for this visit.     Past Surgical  History:  Procedure Laterality Date   CARPAL TUNNEL RELEASE     Right & Left Hand   COLONOSCOPY     POLYPECTOMY     SHOULDER SURGERY Left    left torn bicep   WISDOM TOOTH EXTRACTION       Allergies  Allergen Reactions   Codeine Nausea And Vomiting      Family History  Problem Relation Age of Onset   Colon polyps Father    Crohn's disease Father    Heart disease Father    Irritable bowel syndrome Father    Kidney disease Father    Hypertension Father    Heart failure Mother    Atrial fibrillation Mother    Diabetes Paternal Grandmother    Colon cancer Maternal Aunt    Esophageal cancer Neg Hx    Rectal cancer Neg Hx    Stomach cancer Neg Hx      Social History Mr. Bradsher reports that he has never smoked. He has never used smokeless tobacco. Mr. Loudermilk reports no history of alcohol use.   Review of Systems CONSTITUTIONAL: No weight loss, fever, chills, weakness or fatigue.  HEENT: Eyes: No visual loss, blurred vision, double  vision or yellow sclerae.No hearing loss, sneezing, congestion, runny nose or sore throat.  SKIN: No rash or itching.  CARDIOVASCULAR: per hpi RESPIRATORY: No shortness of breath, cough or sputum.  GASTROINTESTINAL: No anorexia, nausea, vomiting or diarrhea. No abdominal pain or blood.  GENITOURINARY: No burning on urination, no polyuria NEUROLOGICAL: No headache, dizziness, syncope, paralysis, ataxia, numbness or tingling in the extremities. No change in bowel or bladder control.  MUSCULOSKELETAL: No muscle, back pain, joint pain or stiffness.  LYMPHATICS: No enlarged nodes. No history of splenectomy.  PSYCHIATRIC: No history of depression or anxiety.  ENDOCRINOLOGIC: No reports of sweating, cold or heat intolerance. No polyuria or polydipsia.  Marland Kitchen   Physical Examination Today's Vitals   11/10/22 1611  BP: 106/66  Pulse: (!) 50  SpO2: 97%  Weight: 259 lb 12.8 oz (117.8 kg)  Height: '5\' 7"'$  (1.702 m)   Body mass index is 40.69  kg/m.  Gen: resting comfortably, no acute distress HEENT: no scleral icterus, pupils equal round and reactive, no palptable cervical adenopathy,  CV: RRR, no m/rg/ no jvd Resp: Clear to auscultation bilaterally GI: abdomen is soft, non-tender, non-distended, normal bowel sounds, no hepatosplenomegaly MSK: extremities are warm, no edema.  Skin: warm, no rash Neuro:  no focal deficits Psych: appropriate affect   Diagnostic Studies 06/2019 event monitor 48 hr holter monitor Min HR 45, Max HR 151, Avg HR 82 Rare supraventricular ectopy, all in the form of PACs Rare ventricular ectopy, all in the form of isolated PVCs No symptoms reported No significant arrhythmias    Assessment and Plan  1.Dizziness - episodes of exercise induced lightheadedness/presyncope, typicallly occurs in the cool down period. Symptoms do not happen outside of exercise - history of chronic sinus brady, has had palpitations in the past with rare ectopy on monitor - concerns for possibly exericse induced arrhythmia, will plan for GXT - alterative etiology could be orthostatic hypotension, has poor oral hydration. BP low normal now that he has lost 50 lbs, stop norvasc. Encouraged increased hydration.            Arnoldo Lenis, M.D.

## 2022-11-20 ENCOUNTER — Ambulatory Visit (HOSPITAL_COMMUNITY): Payer: Managed Care, Other (non HMO)

## 2024-03-20 ENCOUNTER — Inpatient Hospital Stay (HOSPITAL_COMMUNITY)
Admission: EM | Admit: 2024-03-20 | Discharge: 2024-03-22 | DRG: 394 | Disposition: A | Discharge status: Home / Self Care | Attending: Family Medicine | Admitting: Family Medicine

## 2024-03-20 ENCOUNTER — Emergency Department (HOSPITAL_COMMUNITY)

## 2024-03-20 ENCOUNTER — Other Ambulatory Visit: Payer: Self-pay

## 2024-03-20 ENCOUNTER — Encounter (HOSPITAL_COMMUNITY): Payer: Self-pay

## 2024-03-20 DIAGNOSIS — Y92009 Unspecified place in unspecified non-institutional (private) residence as the place of occurrence of the external cause: Secondary | ICD-10-CM

## 2024-03-20 DIAGNOSIS — Z83719 Family history of colon polyps, unspecified: Secondary | ICD-10-CM

## 2024-03-20 DIAGNOSIS — Z6841 Body Mass Index (BMI) 40.0 and over, adult: Secondary | ICD-10-CM

## 2024-03-20 DIAGNOSIS — K521 Toxic gastroenteritis and colitis: Secondary | ICD-10-CM | POA: Diagnosis not present

## 2024-03-20 DIAGNOSIS — T39395A Adverse effect of other nonsteroidal anti-inflammatory drugs [NSAID], initial encounter: Secondary | ICD-10-CM | POA: Diagnosis present

## 2024-03-20 DIAGNOSIS — K567 Ileus, unspecified: Secondary | ICD-10-CM | POA: Diagnosis present

## 2024-03-20 DIAGNOSIS — N200 Calculus of kidney: Secondary | ICD-10-CM | POA: Diagnosis present

## 2024-03-20 DIAGNOSIS — Z841 Family history of disorders of kidney and ureter: Secondary | ICD-10-CM

## 2024-03-20 DIAGNOSIS — G4733 Obstructive sleep apnea (adult) (pediatric): Secondary | ICD-10-CM | POA: Diagnosis not present

## 2024-03-20 DIAGNOSIS — K828 Other specified diseases of gallbladder: Secondary | ICD-10-CM | POA: Diagnosis present

## 2024-03-20 DIAGNOSIS — K219 Gastro-esophageal reflux disease without esophagitis: Secondary | ICD-10-CM | POA: Diagnosis present

## 2024-03-20 DIAGNOSIS — Z860101 Personal history of adenomatous and serrated colon polyps: Secondary | ICD-10-CM

## 2024-03-20 DIAGNOSIS — Z79899 Other long term (current) drug therapy: Secondary | ICD-10-CM

## 2024-03-20 DIAGNOSIS — K529 Noninfective gastroenteritis and colitis, unspecified: Secondary | ICD-10-CM

## 2024-03-20 DIAGNOSIS — Z885 Allergy status to narcotic agent status: Secondary | ICD-10-CM

## 2024-03-20 DIAGNOSIS — M2550 Pain in unspecified joint: Secondary | ICD-10-CM | POA: Diagnosis present

## 2024-03-20 DIAGNOSIS — E66813 Obesity, class 3: Secondary | ICD-10-CM | POA: Diagnosis present

## 2024-03-20 DIAGNOSIS — R1013 Epigastric pain: Principal | ICD-10-CM

## 2024-03-20 DIAGNOSIS — Z8249 Family history of ischemic heart disease and other diseases of the circulatory system: Secondary | ICD-10-CM

## 2024-03-20 DIAGNOSIS — Z833 Family history of diabetes mellitus: Secondary | ICD-10-CM

## 2024-03-20 DIAGNOSIS — K760 Fatty (change of) liver, not elsewhere classified: Secondary | ICD-10-CM | POA: Diagnosis present

## 2024-03-20 DIAGNOSIS — Z634 Disappearance and death of family member: Secondary | ICD-10-CM

## 2024-03-20 DIAGNOSIS — I10 Essential (primary) hypertension: Secondary | ICD-10-CM | POA: Diagnosis present

## 2024-03-20 DIAGNOSIS — G4730 Sleep apnea, unspecified: Secondary | ICD-10-CM | POA: Diagnosis present

## 2024-03-20 DIAGNOSIS — B9789 Other viral agents as the cause of diseases classified elsewhere: Secondary | ICD-10-CM | POA: Diagnosis present

## 2024-03-20 DIAGNOSIS — Z800 Family history of malignant neoplasm of digestive organs: Secondary | ICD-10-CM

## 2024-03-20 DIAGNOSIS — Z87442 Personal history of urinary calculi: Secondary | ICD-10-CM

## 2024-03-20 LAB — COMPREHENSIVE METABOLIC PANEL WITH GFR
ALT: 20 U/L (ref 0–44)
AST: 18 U/L (ref 15–41)
Albumin: 3.5 g/dL (ref 3.5–5.0)
Alkaline Phosphatase: 42 U/L (ref 38–126)
Anion gap: 9 (ref 5–15)
BUN: 13 mg/dL (ref 6–20)
CO2: 23 mmol/L (ref 22–32)
Calcium: 8.9 mg/dL (ref 8.9–10.3)
Chloride: 107 mmol/L (ref 98–111)
Creatinine, Ser: 0.91 mg/dL (ref 0.61–1.24)
GFR, Estimated: >60 mL/min (ref >60)
Glucose, Bld: 114 mg/dL — ABNORMAL HIGH (ref 70–99)
Potassium: 3.8 mmol/L (ref 3.5–5.1)
Sodium: 139 mmol/L (ref 135–145)
Total Bilirubin: 0.8 mg/dL (ref 0.0–1.2)
Total Protein: 6.4 g/dL — ABNORMAL LOW (ref 6.5–8.1)

## 2024-03-20 LAB — URINALYSIS, ROUTINE W REFLEX MICROSCOPIC
Bilirubin Urine: NEGATIVE
Glucose, UA: NEGATIVE mg/dL
Hgb urine dipstick: NEGATIVE
Ketones, ur: NEGATIVE mg/dL
Leukocytes,Ua: NEGATIVE
Nitrite: NEGATIVE
Protein, ur: NEGATIVE mg/dL
Specific Gravity, Urine: 1.025 (ref 1.005–1.030)
pH: 5 (ref 5.0–8.0)

## 2024-03-20 LAB — CBC
HCT: 44.3 % (ref 39.0–52.0)
Hemoglobin: 15.2 g/dL (ref 13.0–17.0)
MCH: 32.8 pg (ref 26.0–34.0)
MCHC: 34.3 g/dL (ref 30.0–36.0)
MCV: 95.7 fL (ref 80.0–100.0)
Platelets: 231 10*3/uL (ref 150–400)
RBC: 4.63 MIL/uL (ref 4.22–5.81)
RDW: 12.8 % (ref 11.5–15.5)
WBC: 10.2 10*3/uL (ref 4.0–10.5)
nRBC: 0 % (ref 0.0–0.2)

## 2024-03-20 LAB — LIPASE, BLOOD: Lipase: 31 U/L (ref 11–51)

## 2024-03-20 LAB — TROPONIN I (HIGH SENSITIVITY)
Troponin I (High Sensitivity): 4 ng/L (ref <18)
Troponin I (High Sensitivity): 4 ng/L (ref <18)

## 2024-03-20 MED ORDER — HYDROMORPHONE HCL 1 MG/ML IJ SOLN
0.5000 mg | INTRAMUSCULAR | Status: DC | PRN
  Administered 2024-03-21: 0.5 mg via INTRAVENOUS
  Filled 2024-03-20: qty 0.5

## 2024-03-20 MED ORDER — ONDANSETRON HCL 4 MG/2ML IJ SOLN
4.0000 mg | Freq: Four times a day (QID) | INTRAMUSCULAR | Status: DC | PRN
  Administered 2024-03-21 (×2): 4 mg via INTRAVENOUS
  Filled 2024-03-20 (×2): qty 2

## 2024-03-20 MED ORDER — ONDANSETRON HCL 4 MG/2ML IJ SOLN
4.0000 mg | Freq: Once | INTRAMUSCULAR | Status: AC
  Administered 2024-03-20: 4 mg via INTRAVENOUS
  Filled 2024-03-20: qty 2

## 2024-03-20 MED ORDER — ONDANSETRON HCL 4 MG PO TABS
4.0000 mg | ORAL_TABLET | Freq: Four times a day (QID) | ORAL | Status: DC | PRN
  Administered 2024-03-20: 4 mg via ORAL
  Filled 2024-03-20: qty 1

## 2024-03-20 MED ORDER — LIDOCAINE VISCOUS HCL 2 % MT SOLN
15.0000 mL | Freq: Once | OROMUCOSAL | Status: AC
  Administered 2024-03-20: 15 mL via ORAL
  Filled 2024-03-20: qty 15

## 2024-03-20 MED ORDER — PANTOPRAZOLE SODIUM 40 MG IV SOLR
40.0000 mg | Freq: Once | INTRAVENOUS | Status: AC
  Administered 2024-03-20: 40 mg via INTRAVENOUS
  Filled 2024-03-20: qty 10

## 2024-03-20 MED ORDER — ACETAMINOPHEN 650 MG RE SUPP: 650.0000 mg | Freq: Four times a day (QID) | RECTAL | Status: DC | PRN

## 2024-03-20 MED ORDER — HYDROMORPHONE HCL 1 MG/ML IJ SOLN
1.0000 mg | Freq: Once | INTRAMUSCULAR | Status: AC
  Administered 2024-03-20: 1 mg via INTRAVENOUS
  Filled 2024-03-20: qty 1

## 2024-03-20 MED ORDER — LACTATED RINGERS IV SOLN: INTRAVENOUS | Status: DC

## 2024-03-20 MED ORDER — DICYCLOMINE HCL 20 MG PO TABS: 20.0000 mg | ORAL_TABLET | Freq: Two times a day (BID) | ORAL | 0 refills | Status: DC

## 2024-03-20 MED ORDER — LACTATED RINGERS IV BOLUS: 1000.0000 mL | Freq: Once | INTRAVENOUS | Status: DC

## 2024-03-20 MED ORDER — ALUM & MAG HYDROXIDE-SIMETH 200-200-20 MG/5ML PO SUSP
30.0000 mL | Freq: Once | ORAL | Status: AC
  Administered 2024-03-20: 30 mL via ORAL
  Filled 2024-03-20: qty 30

## 2024-03-20 MED ORDER — IOHEXOL 350 MG/ML SOLN
100.0000 mL | Freq: Once | INTRAVENOUS | Status: AC | PRN
  Administered 2024-03-20: 100 mL via INTRAVENOUS

## 2024-03-20 MED ORDER — LACTATED RINGERS IV BOLUS
1000.0000 mL | Freq: Once | INTRAVENOUS | Status: AC
  Administered 2024-03-20: 1000 mL via INTRAVENOUS

## 2024-03-20 MED ORDER — ENOXAPARIN SODIUM 40 MG/0.4ML IJ SOSY
40.0000 mg | PREFILLED_SYRINGE | INTRAMUSCULAR | Status: DC
  Administered 2024-03-21 – 2024-03-22 (×2): 40 mg via SUBCUTANEOUS
  Filled 2024-03-20 (×2): qty 0.4

## 2024-03-20 MED ORDER — TAMSULOSIN HCL 0.4 MG PO CAPS
0.4000 mg | ORAL_CAPSULE | Freq: Every day | ORAL | Status: DC
  Administered 2024-03-20 – 2024-03-21 (×2): 0.4 mg via ORAL
  Filled 2024-03-20 (×2): qty 1

## 2024-03-20 MED ORDER — ACETAMINOPHEN 325 MG PO TABS: 650.0000 mg | ORAL_TABLET | Freq: Four times a day (QID) | ORAL | Status: DC | PRN

## 2024-03-20 NOTE — ED Provider Notes (Signed)
 [place] [facility]MENT AT [facility] Provider Note   CSN: [id number] Arrival date & time: 03/20/24  1638     History  Chief Complaint  Patient presents with   Abdominal Pain    [name] is a 53 y.o. male with PMH as listed below who presents with several days of epigastric abdominal pain rating into the lower abdomen associated with significant belching as well as nausea vomiting and intolerant of p.o.  Since Saturday.  Patient has never had this type of pain before.  Rates the pain as very severe.  Has tried Alka-Seltzer, Pepto-Bismol, sodium bicarb but nothing is helping.  The pain does not travel in his back.  Patient does not drink any alcohol.  He denies any fever/chills, cough, chest pain, heart history except bradycardia, urinary symptoms, flank pain.  No history of abdominal surgical history.   Past Medical History:  Diagnosis Date   Adenomatous polyps    Allergy    Bradycardia    occ in the 40's   GERD (gastroesophageal reflux disease)    Hypertension    Kidney stones    Sleep apnea    wears cpap        Home Medications Prior to Admission medications   Medication Sig Start Date End Date Taking? Authorizing Provider  cetirizine (ZYRTEC) 10 MG tablet Take 10 mg by mouth daily.   Yes [provider]  dexamethasone (DECADRON) 1 MG tablet Take 1 mg by mouth as needed (joint pain).   Yes [provider]  diazepam (VALIUM) 2 MG tablet Take 2 mg by mouth every 12 (twelve) hours as needed (vertigo). 02/15/24  Yes [provider]  dicyclomine (BENTYL) 20 MG tablet Take 1 tablet (20 mg total) by mouth 2 (two) times daily. 03/20/24  Yes Merdis Stalling, MD  Multiple Vitamin (MULTIVITAMIN) tablet Take 1 tablet by mouth daily.   Yes [provider]  ondansetron (ZOFRAN-ODT) 8 MG disintegrating tablet Take 8 mg by mouth every 8 (eight) hours as needed for nausea or vomiting. 03/20/24  Yes [provider]  tamsulosin  (FLOMAX) 0.4 MG CAPS capsule Take 0.4 mg by mouth daily. 06/08/19  Yes [provider]      Allergies    Codeine    Review of Systems   Review of Systems A 10 point review of systems was performed and is negative unless otherwise reported in HPI.  Physical Exam Updated Vital Signs BP 114/71   Pulse 60   Temp 98 F (36.7 C)   Resp 18   Ht 5\' 7"  (1.702 m)   Wt 115.7 kg   SpO2 95%   BMI 39.94 kg/m  Physical Exam General: Uncomfortable appearing male, lying in bed.  HEENT: PERRLA, Sclera anicteric, dry mucous membranes, trachea midline.  Cardiology: RRR, no murmurs/rubs/gallops. Resp: Normal respiratory rate and effort. CTAB, no wheezes, rhonchi, crackles.  Abd: Soft, tenderness palpation in the left upper quadrant, epigastric, and right upper quadrant regions, worse to the epigastric region.  Non-distended. No rebound tenderness or guarding.  GU: Deferred. MSK: No peripheral edema or signs of trauma. Extremities without deformity or TTP. No cyanosis or clubbing. Skin: warm, dry. Back: No CVA tenderness Neuro: A&Ox4, CNs II-XII grossly intact. MAEs. Sensation grossly intact.  Psych: Normal mood and affect.   ED Results / Procedures / Treatments   Labs (all labs ordered are listed, but only abnormal results are displayed) Labs Reviewed  COMPREHENSIVE METABOLIC PANEL WITH GFR - Abnormal; Notable  for the following components:      Result Value   Glucose, Bld 114 (*)    Total Protein 6.4 (*)    All other components within normal limits  URINALYSIS, ROUTINE W REFLEX MICROSCOPIC - Abnormal; Notable for the following components:   Color, Urine AMBER (*)    All other components within normal limits  LIPASE, BLOOD  CBC  TROPONIN I (HIGH SENSITIVITY)  TROPONIN I (HIGH SENSITIVITY)    EKG EKG Interpretation Date/Time:  Monday March 20 2024 17:12:15 EDT Ventricular Rate:  58 PR Interval:  174 QRS Duration:  90 QT Interval:  380 QTC Calculation: 373 R  Axis:   62  Text Interpretation: Sinus bradycardia  Similar to prior Confirmed by Annita Kindle [phone]) on 03/20/2024 5:13:50 PM  Radiology CT Angio Chest/Abd/Pel for Dissection W and/or Wo Contrast Result Date: 03/20/2024 CLINICAL DATA:  Generalized abdominal pain epigastric pain emesis EXAM: CT ANGIOGRAPHY CHEST, ABDOMEN AND PELVIS TECHNIQUE: Non-contrast CT of the chest was initially obtained. Multidetector CT imaging through the chest, abdomen and pelvis was performed using the standard protocol during bolus administration of intravenous contrast. Multiplanar reconstructed images and MIPs were obtained and reviewed to evaluate the vascular anatomy. RADIATION DOSE REDUCTION: This exam was performed according to the departmental dose-optimization program which includes automated exposure control, adjustment of the mA and/or kV according to patient size and/or use of iterative reconstruction technique. CONTRAST:  OMNIPAQUE  IOHEXOL  350 MG/ML SOLN COMPARISON:  CT 06/20/2019 FINDINGS: CTA CHEST FINDINGS Cardiovascular: Non contrasted images of the chest demonstrate no acute intramural hematoma. Nonaneurysmal aorta. No dissection. Normal cardiac size. No pericardial effusion Mediastinum/Nodes: No enlarged mediastinal, hilar, or axillary lymph nodes. Thyroid gland, trachea, and esophagus demonstrate no significant findings. Lungs/Pleura: Lungs are clear. No pleural effusion or pneumothorax. Musculoskeletal: No chest wall abnormality. No acute or significant osseous findings. Review of the MIP images confirms the above findings. CTA ABDOMEN AND PELVIS FINDINGS VASCULAR Aorta: Normal caliber aorta without aneurysm, dissection, vasculitis or significant stenosis. Celiac: Patent without evidence of aneurysm, dissection, vasculitis or significant stenosis. SMA: Patent without evidence of aneurysm, dissection, vasculitis or significant stenosis. Renals: Both renal arteries are patent without evidence of aneurysm,  dissection, vasculitis, fibromuscular dysplasia or significant stenosis. IMA: Patent without evidence of aneurysm, dissection, vasculitis or significant stenosis. Inflow: Patent without evidence of aneurysm, dissection, vasculitis or significant stenosis. Veins: Suboptimally assessed Review of the MIP images confirms the above findings. NON-VASCULAR Hepatobiliary: Hepatic steatosis. No calcified gallstone or biliary dilatation Pancreas: Unremarkable. No pancreatic ductal dilatation or surrounding inflammatory changes. Spleen: Normal in size without focal abnormality. Adrenals/Urinary Tract: Adrenal glands are normal. No hydronephrosis. Punctate nonobstructing left kidney stones. Bladder is unremarkable Stomach/Bowel: The stomach is nonenlarged. Fluid-filled mildly distended mid to distal small bowel without well-defined transition zone. No acute bowel wall thickening. Negative appendix Lymphatic: No suspicious lymph nodes Reproductive: Prostate is unremarkable. Other: No free air.  Small volume fluid in the pelvis and mesentery. Musculoskeletal: No acute osseous abnormality Review of the MIP images confirms the above findings. IMPRESSION: 1. Negative for acute aortic dissection or aneurysm. 2. Fluid-filled mildly distended mid to distal small bowel without well-defined transition zone, possible enteritis or ileus. Small volume fluid in the pelvis and mesentery. 3. Hepatic steatosis. Punctate nonobstructing left kidney stones. Electronically Signed   By: Esmeralda Hedge M.D.   On: 03/20/2024 19:56    Procedures Procedures    Medications Ordered in ED Medications  lactated ringers bolus 1,000 mL (1,000 mLs Intravenous Bolus 03/20/24  1904)  ondansetron (ZOFRAN) injection 4 mg (4 mg Intravenous Given 03/20/24 1909)  HYDROmorphone (DILAUDID) injection 1 mg (1 mg Intravenous Given 03/20/24 1909)  pantoprazole (PROTONIX) injection 40 mg (40 mg Intravenous Given 03/20/24 1908)  iohexol  (OMNIPAQUE ) 350 MG/ML injection 100  mL (100 mLs Intravenous Contrast Given 03/20/24 1922)  alum & mag hydroxide-simeth (MAALOX/MYLANTA) 200-200-20 MG/5ML suspension 30 mL (30 mLs Oral Given 03/20/24 2048)    And  lidocaine (XYLOCAINE) 2 % viscous mouth solution 15 mL (15 mLs Oral Given 03/20/24 2048)    ED Course/ Medical Decision Making/ A&P                          Medical Decision Making Amount and/or Complexity of Data Reviewed Labs: ordered. Decision-making details documented in ED Course. Radiology: ordered. Decision-making details documented in ED Course.  Risk OTC drugs. Prescription drug management. Decision regarding hospitalization.    This patient presents to the ED for concern of abdominal pain nausea vomiting, this involves an extensive number of treatment options, and is a complaint that carries with it a high risk of complications and morbidity.  I considered the following differential and admission for this acute, potentially life threatening condition.   MDM:    Patient with significant abdominal pain nausea vomiting.  EKG does not demonstrate any signs of ischemia.  Consider ACS, gastritis, abdominal aortic dissection or aneurysm, pancreatitis, acute hepatobiliary disease, mesenteric ischemia.  Lower concern for diverticulitis, appendicitis, ureterolithiasis, urinary tract infection.   Clinical Course as of 03/20/24 2215  Mon Mar 20, 2024  1759 Lipase: 31 neg [HN]  1759 CBC wnl [HN]  1759 Comprehensive metabolic panel(!) Unremarkable in the context of this patient's presentation  [HN]  1759 Urinalysis, Routine w reflex microscopic -Urine, Clean Catch(!) neg [HN]  1834 Troponin I (High Sensitivity): 4 neg [HN]  2002 CT Angio Chest/Abd/Pel for Dissection W and/or Wo Contrast 1. Negative for acute aortic dissection or aneurysm. 2. Fluid-filled mildly distended mid to distal small bowel without well-defined transition zone, possible enteritis or ileus. Small volume fluid in the pelvis and  mesentery. 3. Hepatic steatosis. Punctate nonobstructing left kidney stones.   [HN]  2002 Imaging reads possible enteritis or ileus.  Awaiting second troponin, will reevaluate patient. [HN]  2146 Patient attempted PO challenge and had significant 5/10 abdominal pain, despite GI cocktail. He states he cannot drink/eat and will not be able to when he goes home like this. Performed shared decision making with patient/wife and will admit patient to hospital.  [HN]    Clinical Course User Index [HN] Merdis Stalling, MD    Labs: I Ordered, and personally interpreted labs.  The pertinent results include: Those listed above  Imaging Studies ordered: I ordered imaging studies including CT dissection protocol I independently visualized and interpreted imaging. I agree with the radiologist interpretation  Additional history obtained from chart review, wife at bedside.  Cardiac Monitoring: The patient was maintained on a cardiac monitor.  I personally viewed and interpreted the cardiac monitored which showed an underlying rhythm of: Normal sinus rhythm  Reevaluation: After the interventions noted above, I reevaluated the patient and found that they have :improved  Social Determinants of Health:  lives independently  Disposition:  Admit to hospitalist  Co morbidities that complicate the patient evaluation  Past Medical History:  Diagnosis Date   Adenomatous polyps    Allergy    Bradycardia    occ in the 40's   GERD (gastroesophageal  reflux disease)    Hypertension    Kidney stones    Sleep apnea    wears cpap      Medicines Meds ordered this encounter  Medications   DISCONTD: lactated ringers bolus 1,000 mL   lactated ringers bolus 1,000 mL   ondansetron (ZOFRAN) injection 4 mg   HYDROmorphone (DILAUDID) injection 1 mg   pantoprazole (PROTONIX) injection 40 mg   iohexol  (OMNIPAQUE ) 350 MG/ML injection 100 mL   AND Linked Order Group    alum & mag hydroxide-simeth  (MAALOX/MYLANTA) 200-200-20 MG/5ML suspension 30 mL    lidocaine (XYLOCAINE) 2 % viscous mouth solution 15 mL   dicyclomine (BENTYL) 20 MG tablet    Sig: Take 1 tablet (20 mg total) by mouth 2 (two) times daily.    Dispense:  20 tablet    Refill:  0    I have reviewed the patients home medicines and have made adjustments as needed  Problem List / ED Course: Problem List Items Addressed This Visit   None Visit Diagnoses       Epigastric pain    -  Primary     Enteritis                       This note was created using dictation software, which may contain spelling or grammatical errors.    Merdis Stalling, MD 03/20/24 2216

## 2024-03-20 NOTE — ED Notes (Signed)
 ED Provider at bedside.

## 2024-03-20 NOTE — ED Notes (Signed)
 Patient transported to CT

## 2024-03-20 NOTE — ED Notes (Signed)
 ED TO INPATIENT HANDOFF REPORT  ED Nurse Name and Phone #:   S Name/Age/Gender Roy Fuentes 53 y.o. male Room/Bed: APA05/APA05  Code Status   Code Status: Full Code  Home/SNF/Other Home Patient oriented to: self, place, time, and situation Is this baseline? Yes   Triage Complete: Triage complete  Chief Complaint [facility]) [K56.7]  Triage Note Pt arrived via pOV c/o generalized abdominal pain that began 2 days ago. Pt initially pointed to his epigastric area, then reports it radiates to his lower abdomen and up into his esophagus. Pt endorses nausea w/o emesis. Pt reports trying OTC medication sw/o relief. Pt also endorses diarrhea. Pt reports his skin looks more pale in complexion than normal.    Allergies Allergies  Allergen Reactions   Codeine Nausea And Vomiting    Level of Care/Admitting Diagnosis ED Disposition     ED Disposition  Admit   Condition  --   Comment  Hospital Area: [facility] [[id number]]  Level of Care: Med-Surg [16]  Covid Evaluation: Asymptomatic - no recent exposure (last 10 days) testing not required  Diagnosis: [facility]) [[id number]]  Admitting Physician: Walton Guppy [[id number]]  Attending Physician: Walton Guppy [[id number]]          B Medical/Surgery History Past Medical History:  Diagnosis Date   Adenomatous polyps    Allergy    Bradycardia    occ in the 40's   GERD (gastroesophageal reflux disease)    Hypertension    Kidney stones    Sleep apnea    wears cpap    Past Surgical History:  Procedure Laterality Date   CARPAL TUNNEL RELEASE     Right & Left Hand   COLONOSCOPY     POLYPECTOMY     SHOULDER SURGERY Left    left torn bicep   WISDOM TOOTH EXTRACTION       A IV Location/Drains/Wounds Patient Lines/Drains/Airways Status     Active Line/Drains/Airways     Name Placement date Placement time Site Days   Peripheral IV 09/05/14 Right Antecubital 09/05/14  1102  Antecubital  3484   Peripheral IV  06/20/19 Right Antecubital 06/20/19  1125  Antecubital  1735   Peripheral IV 03/20/24 20 G 1" Anterior;Left Forearm 03/20/24  1900  Forearm  less than 1            Intake/Output Last 24 hours No intake or output data in the 24 hours ending 03/20/24 2222  Labs/Imaging Results for orders placed or performed during the hospital encounter of 03/20/24 (from the past 48 hours)  Urinalysis, Routine w reflex microscopic -Urine, Clean Catch     Status: Abnormal   Collection Time: 03/20/24  5:07 PM  Result Value Ref Range   Color, Urine AMBER (A) YELLOW    Comment: BIOCHEMICALS MAY BE AFFECTED BY COLOR   APPearance CLEAR CLEAR   Specific Gravity, Urine 1.025 1.005 - 1.030   pH 5.0 5.0 - 8.0   Glucose, UA NEGATIVE NEGATIVE mg/dL   Hgb urine dipstick NEGATIVE NEGATIVE   Bilirubin Urine NEGATIVE NEGATIVE   Ketones, ur NEGATIVE NEGATIVE mg/dL   Protein, ur NEGATIVE NEGATIVE mg/dL   Nitrite NEGATIVE NEGATIVE   Leukocytes,Ua NEGATIVE NEGATIVE    Comment: Performed at [facility], [address], [place], [place] [address]  Lipase, blood     Status: None   Collection Time: 03/20/24  5:20 PM  Result Value Ref Range   Lipase 31 11 - 51 U/L  Comment: Performed at [facility], [address] [place], [place] [address]  Comprehensive metabolic panel     Status: Abnormal   Collection Time: 03/20/24  5:20 PM  Result Value Ref Range   Sodium 139 135 - 145 mmol/L   Potassium 3.8 3.5 - 5.1 mmol/L   Chloride 107 98 - 111 mmol/L   CO2 23 22 - 32 mmol/L   Glucose, Bld 114 (H) 70 - 99 mg/dL    Comment: Glucose reference range applies only to samples taken after fasting for at least 8 hours.   BUN 13 6 - 20 mg/dL   Creatinine, Ser 0.34 0.61 - 1.24 mg/dL   Calcium 8.9 8.9 - 74.2 mg/dL   Total Protein 6.4 (L) 6.5 - 8.1 g/dL   Albumin 3.5 3.5 - 5.0 g/dL   AST 18 15 - 41 U/L   ALT 20 0 - 44 U/L   Alkaline Phosphatase 42 38 - 126 U/L   Total Bilirubin 0.8 0.0 - 1.2 mg/dL   GFR, Estimated >59 >56  mL/min    Comment: (NOTE) Calculated using the CKD-EPI Creatinine Equation (2021)    Anion gap 9 5 - 15    Comment: Performed at [facility], [address] [place], [place] [address]  CBC     Status: None   Collection Time: 03/20/24  5:20 PM  Result Value Ref Range   WBC 10.2 4.0 - 10.5 K/uL   RBC 4.63 4.22 - 5.81 MIL/uL   Hemoglobin 15.2 13.0 - 17.0 g/dL   HCT 43.3 29.5 - 18.8 %   MCV 95.7 80.0 - 100.0 fL   MCH 32.8 26.0 - 34.0 pg   MCHC 34.3 30.0 - 36.0 g/dL   RDW 41.6 60.6 - 30.1 %   Platelets 231 150 - 400 K/uL   nRBC 0.0 0.0 - 0.2 %    Comment: Performed at [facility], [address], [place], [place] [address]  Troponin I (High Sensitivity)     Status: None   Collection Time: 03/20/24  5:20 PM  Result Value Ref Range   Troponin I (High Sensitivity) 4 <18 ng/L    Comment: (NOTE) Elevated high sensitivity troponin I (hsTnI) values and significant  changes across serial measurements may suggest ACS but many other  chronic and acute conditions are known to elevate hsTnI results.  Refer to the "Links" section for chest pain algorithms and additional  guidance. Performed at [facility], [address] [place], [place] [address]   Troponin I (High Sensitivity)     Status: None   Collection Time: 03/20/24  7:35 PM  Result Value Ref Range   Troponin I (High Sensitivity) 4 <18 ng/L    Comment: (NOTE) Elevated high sensitivity troponin I (hsTnI) values and significant  changes across serial measurements may suggest ACS but many other  chronic and acute conditions are known to elevate hsTnI results.  Refer to the "Links" section for chest pain algorithms and additional  guidance. Performed at [facility], [address] [place], [place] [address]    CT Angio Chest/Abd/Pel for Dissection W and/or Wo Contrast Result Date: 03/20/2024 CLINICAL DATA:  Generalized abdominal pain epigastric pain emesis EXAM: CT ANGIOGRAPHY CHEST, ABDOMEN AND PELVIS TECHNIQUE: Non-contrast CT of  the chest was initially obtained. Multidetector CT imaging through the chest, abdomen and pelvis was performed using the standard protocol during bolus administration of intravenous contrast. Multiplanar reconstructed images and MIPs were obtained and reviewed to evaluate the vascular anatomy. RADIATION DOSE  REDUCTION: This exam was performed according to the departmental dose-optimization program which includes automated exposure control, adjustment of the mA and/or kV according to patient size and/or use of iterative reconstruction technique. CONTRAST:  OMNIPAQUE  IOHEXOL  350 MG/ML SOLN COMPARISON:  CT 06/20/2019 FINDINGS: CTA CHEST FINDINGS Cardiovascular: Non contrasted images of the chest demonstrate no acute intramural hematoma. Nonaneurysmal aorta. No dissection. Normal cardiac size. No pericardial effusion Mediastinum/Nodes: No enlarged mediastinal, hilar, or axillary lymph nodes. Thyroid gland, trachea, and esophagus demonstrate no significant findings. Lungs/Pleura: Lungs are clear. No pleural effusion or pneumothorax. Musculoskeletal: No chest wall abnormality. No acute or significant osseous findings. Review of the MIP images confirms the above findings. CTA ABDOMEN AND PELVIS FINDINGS VASCULAR Aorta: Normal caliber aorta without aneurysm, dissection, vasculitis or significant stenosis. Celiac: Patent without evidence of aneurysm, dissection, vasculitis or significant stenosis. SMA: Patent without evidence of aneurysm, dissection, vasculitis or significant stenosis. Renals: Both renal arteries are patent without evidence of aneurysm, dissection, vasculitis, fibromuscular dysplasia or significant stenosis. IMA: Patent without evidence of aneurysm, dissection, vasculitis or significant stenosis. Inflow: Patent without evidence of aneurysm, dissection, vasculitis or significant stenosis. Veins: Suboptimally assessed Review of the MIP images confirms the above findings. NON-VASCULAR Hepatobiliary:  Hepatic steatosis. No calcified gallstone or biliary dilatation Pancreas: Unremarkable. No pancreatic ductal dilatation or surrounding inflammatory changes. Spleen: Normal in size without focal abnormality. Adrenals/Urinary Tract: Adrenal glands are normal. No hydronephrosis. Punctate nonobstructing left kidney stones. Bladder is unremarkable Stomach/Bowel: The stomach is nonenlarged. Fluid-filled mildly distended mid to distal small bowel without well-defined transition zone. No acute bowel wall thickening. Negative appendix Lymphatic: No suspicious lymph nodes Reproductive: Prostate is unremarkable. Other: No free air.  Small volume fluid in the pelvis and mesentery. Musculoskeletal: No acute osseous abnormality Review of the MIP images confirms the above findings. IMPRESSION: 1. Negative for acute aortic dissection or aneurysm. 2. Fluid-filled mildly distended mid to distal small bowel without well-defined transition zone, possible enteritis or ileus. Small volume fluid in the pelvis and mesentery. 3. Hepatic steatosis. Punctate nonobstructing left kidney stones. Electronically Signed   By: Esmeralda Hedge M.D.   On: 03/20/2024 19:56    Pending Labs Unresulted Labs (From admission, onward)     Start     Ordered   03/27/24 0500  Creatinine, serum  (enoxaparin (LOVENOX)    CrCl >/= 30 ml/min)  Weekly,   R     Comments: while on enoxaparin therapy    03/20/24 2220   03/21/24 0500  HIV Antibody (routine testing w rflx)  (HIV Antibody (Routine testing w reflex) panel)  Tomorrow morning,   R        03/20/24 2220   03/21/24 0500  Basic metabolic panel  Tomorrow morning,   R        03/20/24 2220   03/21/24 0500  Magnesium  Tomorrow morning,   R        03/20/24 2220   03/21/24 0500  CBC  Tomorrow morning,   R        03/20/24 2220            Vitals/Pain Today's Vitals   03/20/24 2030 03/20/24 2048 03/20/24 2100 03/20/24 2200  BP: 104/68  109/70 114/71  Pulse: 60  60 60  Resp: 17  17 18   Temp:       TempSrc:      SpO2: 95%  93% 95%  Weight:      Height:      PainSc:  5  Isolation Precautions No active isolations  Medications Medications  tamsulosin (FLOMAX) capsule 0.4 mg (has no administration in time range)  enoxaparin (LOVENOX) injection 40 mg (has no administration in time range)  lactated ringers infusion (has no administration in time range)  acetaminophen (TYLENOL) tablet 650 mg (has no administration in time range)    Or  acetaminophen (TYLENOL) suppository 650 mg (has no administration in time range)  HYDROmorphone (DILAUDID) injection 0.5 mg (has no administration in time range)  ondansetron (ZOFRAN) tablet 4 mg (has no administration in time range)    Or  ondansetron (ZOFRAN) injection 4 mg (has no administration in time range)  lactated ringers bolus 1,000 mL (1,000 mLs Intravenous Bolus 03/20/24 1904)  ondansetron (ZOFRAN) injection 4 mg (4 mg Intravenous Given 03/20/24 1909)  HYDROmorphone (DILAUDID) injection 1 mg (1 mg Intravenous Given 03/20/24 1909)  pantoprazole (PROTONIX) injection 40 mg (40 mg Intravenous Given 03/20/24 1908)  iohexol  (OMNIPAQUE ) 350 MG/ML injection 100 mL (100 mLs Intravenous Contrast Given 03/20/24 1922)  alum & mag hydroxide-simeth (MAALOX/MYLANTA) 200-200-20 MG/5ML suspension 30 mL (30 mLs Oral Given 03/20/24 2048)    And  lidocaine (XYLOCAINE) 2 % viscous mouth solution 15 mL (15 mLs Oral Given 03/20/24 2048)    Mobility walks     Focused Assessments    R Recommendations: See Admitting Provider Note  Report given to:   Additional Notes:

## 2024-03-20 NOTE — H&P (Signed)
 History and Physical    Roy Fuentes:[id number] DOB: 1971-05-14 DOA: 03/20/2024  PCP: Huston Maiers, MD   Patient coming from: Home   Chief Complaint: Abdominal pain, nausea   HPI: Roy Fuentes is a 53 y.o. male with medical history significant for OSA on CPAP who presents with abdominal pain and nausea.  Patient reports 2 days of abdominal pain and nausea.  He describes pain in the upper abdomen, nausea without vomiting, and 1 loose stool today.  Pain is worse whenever he tries to eat something.  Prior to today, he had not had a BM in 2 or 3 days.  He denies fever or chills.  He denies any surgical history.  He has not been started on any new medications recently.  ED Course: Upon arrival to the ED, patient is found to be afebrile and saturating well on room air with stable BP.  Labs are most notable for normal creatinine, normal electrolytes, normal CBC, normal troponin x 2, and normal LFTs and lipase.  CT demonstrates fluid-filled mildly distended mid to distal small bowel which could reflect enteritis or ileus.  Patient was treated in the ED with a liter of LR, Dilaudid, Zofran, Protonix, and GI cocktail.  Review of Systems:  All other systems reviewed and apart from HPI, are negative.  Past Medical History:  Diagnosis Date   Adenomatous polyps    Allergy    Bradycardia    occ in the 40's   GERD (gastroesophageal reflux disease)    Hypertension    Kidney stones    Sleep apnea    wears cpap     Past Surgical History:  Procedure Laterality Date   CARPAL TUNNEL RELEASE     Right & Left Hand   COLONOSCOPY     POLYPECTOMY     SHOULDER SURGERY Left    left torn bicep   WISDOM TOOTH EXTRACTION      Social History:   reports that he has never smoked. He has never used smokeless tobacco. He reports that he does not drink alcohol and does not use drugs.  Allergies  Allergen Reactions   Codeine Nausea And Vomiting    Family History  Problem Relation Age of  Onset   Colon polyps Father    Crohn's disease Father    Heart disease Father    Irritable bowel syndrome Father    Kidney disease Father    Hypertension Father    Heart failure Mother    Atrial fibrillation Mother    Diabetes Paternal Grandmother    Colon cancer Maternal Aunt    Esophageal cancer Neg Hx    Rectal cancer Neg Hx    Stomach cancer Neg Hx      Prior to Admission medications   Medication Sig Start Date End Date Taking? Authorizing Provider  cetirizine (ZYRTEC) 10 MG tablet Take 10 mg by mouth daily.   Yes [provider]  dexamethasone (DECADRON) 1 MG tablet Take 1 mg by mouth as needed (joint pain).   Yes [provider]  diazepam (VALIUM) 2 MG tablet Take 2 mg by mouth every 12 (twelve) hours as needed (vertigo). 02/15/24  Yes [provider]  dicyclomine (BENTYL) 20 MG tablet Take 1 tablet (20 mg total) by mouth 2 (two) times daily. 03/20/24  Yes Merdis Stalling, MD  Multiple Vitamin (MULTIVITAMIN) tablet Take 1 tablet by mouth daily.   Yes [provider]  ondansetron (ZOFRAN-ODT) 8 MG disintegrating tablet Take 8  mg by mouth every 8 (eight) hours as needed for nausea or vomiting. 03/20/24  Yes [provider]  tamsulosin (FLOMAX) 0.4 MG CAPS capsule Take 0.4 mg by mouth daily. 06/08/19  Yes [provider]    Physical Exam: Vitals:   03/20/24 1942 03/20/24 2030 03/20/24 2100 03/20/24 2200  BP: 110/69 104/68 109/70 114/71  Pulse: (!) 59 60 60 60  Resp: 17 17 17 18   Temp: 98 F (36.7 C)     TempSrc:      SpO2: 95% 95% 93% 95%  Weight:      Height:        Constitutional: NAD, calm  Eyes: PERTLA, lids and conjunctivae normal ENMT: Mucous membranes are moist. Posterior pharynx clear of any exudate or lesions.   Neck: supple, no masses  Respiratory:  no wheezing, no crackles. No accessory muscle use.  Cardiovascular: S1 & S2 heard, regular rate and rhythm. No JVD. Abdomen: Soft, generally tender without  guarding. Bowel sounds hypoactive.  Musculoskeletal: no clubbing / cyanosis. No joint deformity upper and lower extremities.   Skin: no significant rashes, lesions, ulcers. Warm, dry, well-perfused. Neurologic: CN 2-12 grossly intact. Moving all extremities. Alert and oriented.  Psychiatric: Pleasant. Cooperative.    Labs and Imaging on Admission: I have personally reviewed following labs and imaging studies  CBC: Recent Labs  Lab 03/20/24 1720  WBC 10.2  HGB 15.2  HCT 44.3  MCV 95.7  PLT 231   Basic Metabolic Panel: Recent Labs  Lab 03/20/24 1720  NA 139  K 3.8  CL 107  CO2 23  GLUCOSE 114*  BUN 13  CREATININE 0.91  CALCIUM 8.9   GFR: Estimated Creatinine Clearance: 114.1 mL/min (by C-G formula based on SCr of 0.91 mg/dL). Liver Function Tests: Recent Labs  Lab 03/20/24 1720  AST 18  ALT 20  ALKPHOS 42  BILITOT 0.8  PROT 6.4*  ALBUMIN 3.5   Recent Labs  Lab 03/20/24 1720  LIPASE 31   No results for input(s): "AMMONIA" in the last 168 hours. Coagulation Profile: No results for input(s): "INR", "PROTIME" in the last 168 hours. Cardiac Enzymes: No results for input(s): "CKTOTAL", "CKMB", "CKMBINDEX", "TROPONINI" in the last 168 hours. BNP (last 3 results) No results for input(s): "PROBNP" in the last 8760 hours. HbA1C: No results for input(s): "HGBA1C" in the last 72 hours. CBG: No results for input(s): "GLUCAP" in the last 168 hours. Lipid Profile: No results for input(s): "CHOL", "HDL", "LDLCALC", "TRIG", "CHOLHDL", "LDLDIRECT" in the last 72 hours. Thyroid Function Tests: No results for input(s): "TSH", "T4TOTAL", "FREET4", "T3FREE", "THYROIDAB" in the last 72 hours. Anemia Panel: No results for input(s): "VITAMINB12", "FOLATE", "FERRITIN", "TIBC", "IRON", "RETICCTPCT" in the last 72 hours. Urine analysis:    Component Value Date/Time   COLORURINE AMBER (A) 03/20/2024 1707   APPEARANCEUR CLEAR 03/20/2024 1707   LABSPEC 1.025 03/20/2024 1707    PHURINE 5.0 03/20/2024 1707   GLUCOSEU NEGATIVE 03/20/2024 1707   HGBUR NEGATIVE 03/20/2024 1707   BILIRUBINUR NEGATIVE 03/20/2024 1707   KETONESUR NEGATIVE 03/20/2024 1707   PROTEINUR NEGATIVE 03/20/2024 1707   NITRITE NEGATIVE 03/20/2024 1707   LEUKOCYTESUR NEGATIVE 03/20/2024 1707   Sepsis Labs: @LABRCNTIP (procalcitonin:4,lacticidven:4) )No results found for this or any previous visit (from the past 240 hours).   Radiological Exams on Admission: CT Angio Chest/Abd/Pel for Dissection W and/or Wo Contrast Result Date: 03/20/2024 CLINICAL DATA:  Generalized abdominal pain epigastric pain emesis EXAM: CT ANGIOGRAPHY CHEST, ABDOMEN AND PELVIS TECHNIQUE: Non-contrast CT of  the chest was initially obtained. Multidetector CT imaging through the chest, abdomen and pelvis was performed using the standard protocol during bolus administration of intravenous contrast. Multiplanar reconstructed images and MIPs were obtained and reviewed to evaluate the vascular anatomy. RADIATION DOSE REDUCTION: This exam was performed according to the departmental dose-optimization program which includes automated exposure control, adjustment of the mA and/or kV according to patient size and/or use of iterative reconstruction technique. CONTRAST:  OMNIPAQUE  IOHEXOL  350 MG/ML SOLN COMPARISON:  CT 06/20/2019 FINDINGS: CTA CHEST FINDINGS Cardiovascular: Non contrasted images of the chest demonstrate no acute intramural hematoma. Nonaneurysmal aorta. No dissection. Normal cardiac size. No pericardial effusion Mediastinum/Nodes: No enlarged mediastinal, hilar, or axillary lymph nodes. Thyroid gland, trachea, and esophagus demonstrate no significant findings. Lungs/Pleura: Lungs are clear. No pleural effusion or pneumothorax. Musculoskeletal: No chest wall abnormality. No acute or significant osseous findings. Review of the MIP images confirms the above findings. CTA ABDOMEN AND PELVIS FINDINGS VASCULAR Aorta: Normal caliber  aorta without aneurysm, dissection, vasculitis or significant stenosis. Celiac: Patent without evidence of aneurysm, dissection, vasculitis or significant stenosis. SMA: Patent without evidence of aneurysm, dissection, vasculitis or significant stenosis. Renals: Both renal arteries are patent without evidence of aneurysm, dissection, vasculitis, fibromuscular dysplasia or significant stenosis. IMA: Patent without evidence of aneurysm, dissection, vasculitis or significant stenosis. Inflow: Patent without evidence of aneurysm, dissection, vasculitis or significant stenosis. Veins: Suboptimally assessed Review of the MIP images confirms the above findings. NON-VASCULAR Hepatobiliary: Hepatic steatosis. No calcified gallstone or biliary dilatation Pancreas: Unremarkable. No pancreatic ductal dilatation or surrounding inflammatory changes. Spleen: Normal in size without focal abnormality. Adrenals/Urinary Tract: Adrenal glands are normal. No hydronephrosis. Punctate nonobstructing left kidney stones. Bladder is unremarkable Stomach/Bowel: The stomach is nonenlarged. Fluid-filled mildly distended mid to distal small bowel without well-defined transition zone. No acute bowel wall thickening. Negative appendix Lymphatic: No suspicious lymph nodes Reproductive: Prostate is unremarkable. Other: No free air.  Small volume fluid in the pelvis and mesentery. Musculoskeletal: No acute osseous abnormality Review of the MIP images confirms the above findings. IMPRESSION: 1. Negative for acute aortic dissection or aneurysm. 2. Fluid-filled mildly distended mid to distal small bowel without well-defined transition zone, possible enteritis or ileus. Small volume fluid in the pelvis and mesentery. 3. Hepatic steatosis. Punctate nonobstructing left kidney stones. Electronically Signed   By: Esmeralda Hedge M.D.   On: 03/20/2024 19:56    EKG: Independently reviewed. Sinus bradycardia, rate 58.   Assessment/Plan   1. Ileus  - Keep  NPO for now, continue IVF hydration, symptom-control, monitor and correct electrolytes, consider trial of clears in am if symptoms controlled   2. OSA  - CPAP while sleeping    DVT prophylaxis: Lovenox  Code Status: Full  Level of Care: Level of care: Med-Surg Family Communication: None present    Disposition Plan:  Patient is from: home  Anticipated d/c is to: Home  Anticipated d/c date is: 6/10 or 03/22/24  Patient currently: Pending tolerance of adequate oral intake  Consults called: None  Admission status: Observation     Walton Guppy, MD [facility]  03/20/2024, 10:22 PM

## 2024-03-20 NOTE — ED Notes (Signed)
 Dr. Antionette Char at bedside

## 2024-03-20 NOTE — ED Triage Notes (Signed)
 Pt arrived via pOV c/o generalized abdominal pain that began 2 days ago. Pt initially pointed to his epigastric area, then reports it radiates to his lower abdomen and up into his esophagus. Pt endorses nausea w/o emesis. Pt reports trying OTC medication sw/o relief. Pt also endorses diarrhea. Pt reports his skin looks more pale in complexion than normal.

## 2024-03-20 NOTE — ED Notes (Signed)
 Pt says pain came back after eating couple of crackers and drinking gingerale

## 2024-03-20 NOTE — Discharge Instructions (Addendum)
 Thank you for coming to [facility] Emergency Department. You were seen for abdominal pain, nausea/vomiting. We did an exam, labs, and imaging, and these showed enteritis, or inflammation in the small bowel. This is likely caused by a viral infection. Please take bentyl 20 mg twice per day as needed for abdominal cramping. You can take the zofran previously prescribed under the tongue every 6-8 hours as needed for nausea/vomiting.   Please follow up with your primary care provider within 1 week.   Do not hesitate to return to the ED or call 911 if you experience: -Worsening symptoms -Nausea/vomiting so severe you cannot eat, drink or take your medications -Lightheadedness, passing out -Fevers/chills -Anything else that concerns you

## 2024-03-21 ENCOUNTER — Observation Stay (HOSPITAL_COMMUNITY)

## 2024-03-21 ENCOUNTER — Encounter (HOSPITAL_COMMUNITY): Payer: Self-pay | Admitting: Family Medicine

## 2024-03-21 ENCOUNTER — Ambulatory Visit: Admitting: Nurse Practitioner

## 2024-03-21 DIAGNOSIS — T39395A Adverse effect of other nonsteroidal anti-inflammatory drugs [NSAID], initial encounter: Secondary | ICD-10-CM | POA: Diagnosis present

## 2024-03-21 DIAGNOSIS — Z8249 Family history of ischemic heart disease and other diseases of the circulatory system: Secondary | ICD-10-CM | POA: Diagnosis not present

## 2024-03-21 DIAGNOSIS — E66813 Obesity, class 3: Secondary | ICD-10-CM | POA: Diagnosis present

## 2024-03-21 DIAGNOSIS — B9789 Other viral agents as the cause of diseases classified elsewhere: Secondary | ICD-10-CM | POA: Diagnosis present

## 2024-03-21 DIAGNOSIS — K529 Noninfective gastroenteritis and colitis, unspecified: Secondary | ICD-10-CM

## 2024-03-21 DIAGNOSIS — R1013 Epigastric pain: Secondary | ICD-10-CM

## 2024-03-21 DIAGNOSIS — I10 Essential (primary) hypertension: Secondary | ICD-10-CM | POA: Diagnosis present

## 2024-03-21 DIAGNOSIS — Z634 Disappearance and death of family member: Secondary | ICD-10-CM | POA: Diagnosis not present

## 2024-03-21 DIAGNOSIS — Z83719 Family history of colon polyps, unspecified: Secondary | ICD-10-CM | POA: Diagnosis not present

## 2024-03-21 DIAGNOSIS — K760 Fatty (change of) liver, not elsewhere classified: Secondary | ICD-10-CM

## 2024-03-21 DIAGNOSIS — K219 Gastro-esophageal reflux disease without esophagitis: Secondary | ICD-10-CM | POA: Diagnosis present

## 2024-03-21 DIAGNOSIS — N200 Calculus of kidney: Secondary | ICD-10-CM | POA: Diagnosis present

## 2024-03-21 DIAGNOSIS — K828 Other specified diseases of gallbladder: Secondary | ICD-10-CM | POA: Diagnosis present

## 2024-03-21 DIAGNOSIS — Z800 Family history of malignant neoplasm of digestive organs: Secondary | ICD-10-CM | POA: Diagnosis not present

## 2024-03-21 DIAGNOSIS — Z87442 Personal history of urinary calculi: Secondary | ICD-10-CM | POA: Diagnosis not present

## 2024-03-21 DIAGNOSIS — Z860101 Personal history of adenomatous and serrated colon polyps: Secondary | ICD-10-CM | POA: Diagnosis not present

## 2024-03-21 DIAGNOSIS — K521 Toxic gastroenteritis and colitis: Secondary | ICD-10-CM | POA: Diagnosis present

## 2024-03-21 DIAGNOSIS — R110 Nausea: Secondary | ICD-10-CM

## 2024-03-21 DIAGNOSIS — Z833 Family history of diabetes mellitus: Secondary | ICD-10-CM | POA: Diagnosis not present

## 2024-03-21 DIAGNOSIS — Z841 Family history of disorders of kidney and ureter: Secondary | ICD-10-CM | POA: Diagnosis not present

## 2024-03-21 DIAGNOSIS — R197 Diarrhea, unspecified: Secondary | ICD-10-CM | POA: Diagnosis not present

## 2024-03-21 DIAGNOSIS — Y92009 Unspecified place in unspecified non-institutional (private) residence as the place of occurrence of the external cause: Secondary | ICD-10-CM | POA: Diagnosis not present

## 2024-03-21 DIAGNOSIS — Z6841 Body Mass Index (BMI) 40.0 and over, adult: Secondary | ICD-10-CM | POA: Diagnosis not present

## 2024-03-21 DIAGNOSIS — R1030 Lower abdominal pain, unspecified: Secondary | ICD-10-CM

## 2024-03-21 DIAGNOSIS — Z885 Allergy status to narcotic agent status: Secondary | ICD-10-CM | POA: Diagnosis not present

## 2024-03-21 DIAGNOSIS — K567 Ileus, unspecified: Secondary | ICD-10-CM | POA: Diagnosis present

## 2024-03-21 DIAGNOSIS — G4733 Obstructive sleep apnea (adult) (pediatric): Secondary | ICD-10-CM | POA: Diagnosis present

## 2024-03-21 DIAGNOSIS — Z79899 Other long term (current) drug therapy: Secondary | ICD-10-CM | POA: Diagnosis not present

## 2024-03-21 DIAGNOSIS — M2550 Pain in unspecified joint: Secondary | ICD-10-CM | POA: Diagnosis present

## 2024-03-21 LAB — HIV ANTIBODY (ROUTINE TESTING W REFLEX): HIV Screen 4th Generation wRfx: NONREACTIVE

## 2024-03-21 LAB — BASIC METABOLIC PANEL WITH GFR
Anion gap: 7 (ref 5–15)
BUN: 11 mg/dL (ref 6–20)
CO2: 27 mmol/L (ref 22–32)
Calcium: 8.6 mg/dL — ABNORMAL LOW (ref 8.9–10.3)
Chloride: 106 mmol/L (ref 98–111)
Creatinine, Ser: 0.88 mg/dL (ref 0.61–1.24)
GFR, Estimated: >60 mL/min (ref >60)
Glucose, Bld: 92 mg/dL (ref 70–99)
Potassium: 4.3 mmol/L (ref 3.5–5.1)
Sodium: 140 mmol/L (ref 135–145)

## 2024-03-21 LAB — CBC
HCT: 42 % (ref 39.0–52.0)
Hemoglobin: 14.2 g/dL (ref 13.0–17.0)
MCH: 32.8 pg (ref 26.0–34.0)
MCHC: 33.8 g/dL (ref 30.0–36.0)
MCV: 97 fL (ref 80.0–100.0)
Platelets: 217 10*3/uL (ref 150–400)
RBC: 4.33 MIL/uL (ref 4.22–5.81)
RDW: 12.9 % (ref 11.5–15.5)
WBC: 7.7 10*3/uL (ref 4.0–10.5)
nRBC: 0 % (ref 0.0–0.2)

## 2024-03-21 LAB — SEDIMENTATION RATE: Sed Rate: 9 mm/h (ref 0–20)

## 2024-03-21 LAB — C DIFFICILE QUICK SCREEN W PCR REFLEX
C Diff antigen: NEGATIVE
C Diff interpretation: NOT DETECTED
C Diff toxin: NEGATIVE

## 2024-03-21 LAB — C-REACTIVE PROTEIN: CRP: 2 mg/dL — ABNORMAL HIGH (ref <1.0)

## 2024-03-21 LAB — MAGNESIUM: Magnesium: 2 mg/dL (ref 1.7–2.4)

## 2024-03-21 MED ORDER — PANTOPRAZOLE SODIUM 40 MG IV SOLR
40.0000 mg | Freq: Two times a day (BID) | INTRAVENOUS | Status: DC
  Administered 2024-03-21 – 2024-03-22 (×3): 40 mg via INTRAVENOUS
  Filled 2024-03-21 (×3): qty 10

## 2024-03-21 MED ORDER — LACTATED RINGERS IV SOLN: INTRAVENOUS | Status: AC

## 2024-03-21 MED ORDER — METOCLOPRAMIDE HCL 5 MG/ML IJ SOLN
5.0000 mg | Freq: Three times a day (TID) | INTRAMUSCULAR | Status: DC
  Administered 2024-03-21 (×2): 5 mg via INTRAVENOUS
  Filled 2024-03-21 (×2): qty 2

## 2024-03-21 MED ORDER — LACTATED RINGERS IV BOLUS
1000.0000 mL | Freq: Once | INTRAVENOUS | Status: AC
  Administered 2024-03-21: 1000 mL via INTRAVENOUS

## 2024-03-21 MED ORDER — DICYCLOMINE HCL 10 MG PO CAPS
10.0000 mg | ORAL_CAPSULE | Freq: Three times a day (TID) | ORAL | Status: DC | PRN
  Administered 2024-03-21 – 2024-03-22 (×3): 10 mg via ORAL
  Filled 2024-03-21 (×3): qty 1

## 2024-03-21 NOTE — TOC CM/SW Note (Signed)
 Transition of Care [facility]) - Inpatient Brief Assessment   Patient Details  Name: Roy Fuentes MRN: [id number] Date of Birth: 26-Dec-1970  Transition of Care [facility]) CM/SW Contact:    Grandville Lax, LCSWA Phone Number: 03/21/2024, 11:38 AM   Clinical Narrative: Transition of Care Department [facility]) has reviewed patient and no TOC needs have been identified at this time. We will continue to monitor patient advancement through interdiciplinary progression rounds. If new patient transition needs arise, please place a TOC consult.  Transition of Care Asessment: Insurance and Status: Insurance coverage has been reviewed Patient has primary care physician: Yes Home environment has been reviewed: From home Prior level of function:: Independent Prior/Current Home Services: No current home services Social Drivers of Health Review: SDOH reviewed no interventions necessary Readmission risk has been reviewed: Yes Transition of care needs: no transition of care needs at this time

## 2024-03-21 NOTE — Hospital Course (Signed)
 Roy Fuentes is a 53 y.o. male with medical history significant for OSA on CPAP who presents with abdominal pain and nausea.   Patient reports 2 days of abdominal pain and nausea.  He describes pain in the upper abdomen, nausea without vomiting, and 1 loose stool today.  Pain is worse whenever he tries to eat something.  Prior to today, he had not had a BM in 2 or 3 days.  He denies fever or chills.  He denies any surgical history.  He has not been started on any new medications recently.   ED Course: Upon arrival to the ED, patient is found to be afebrile and saturating well on room air with stable BP.  Labs are most notable for normal creatinine, normal electrolytes, normal CBC, normal troponin x 2, and normal LFTs and lipase.  CT demonstrates fluid-filled mildly distended mid to distal small bowel which could reflect enteritis or ileus.   Patient was treated in the ED with a liter of LR, Dilaudid, Zofran, Protonix, and GI cocktai   Assessment/Plan    1. Ileus  - Keep NPO for now, continue IVF hydration, symptom-control, monitor and correct electrolytes, consider trial of clears in am if symptoms controlled    2. OSA  - CPAP while sleeping

## 2024-03-21 NOTE — Consult Note (Signed)
 Gastroenterology Consult   Referring Provider: Dr. Eilene Grater Primary Care Physician:  Huston Maiers, MD Primary Gastroenterologist:  previously Dr. Elvin Hammer with [facility]  Patient ID: Roy Fuentes; [id number]; Nov 12, 1970   Admit date: 03/20/2024  LOS: 0 days   Date of Consultation: 03/21/2024  Reason for Consultation:  Abdominal pain, enteritis, possible ileus  History of Present Illness   Roy Fuentes is a 53 y.o. year old male with a history of remote colonic adenomas, GERD, HTN, renal stones, sleep apnea, who presented to the ED yesterday with several days of abdominal pain, belching, nausea but no vomiting, diarrhea, and unable to tolerate oral intake. GI has been consulted due to findings on CT representing possible enteritis or ileus.   In the ED: lipase 31, LFTs normal, WBC count 10.2, Hgb 15.2, troponins negative, Cdiff negative, GI panel in process.   CTA chest/abd/pelvis: fluid-filled mildly distended mid to distal small bowel without well-defined transition zone, possible enteritis or ileus, hepatic steatosis. Mesenteric vasculature patent.   RUQ US  has been ordered at wife's request.   Today: Wife at bedside and provides significant portion of information. Patient initially asleep but woke during consultation, and wife continued to share history. She is highly concerned about CT findings. 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 States patient had indigestion starting on Saturday. Took Catering manager. Usually calms down in the past if takes this for indigestion.  Took nexium 40 BID, bicarb from wife. Symptoms continued to worsen and had epigastric pain and nausea. No vomiting. Still worked yesterday and worsened while moving. Had [facility] appointment today but came to ED. No vomiting. No fever/chills. No relief with BM. Stool has been green. Watery. Yesterday 2 watery stools. Today 5 loose stools. Green mucus stool. Symptoms worsened  through weekend. No rectal bleeding. Has lots of joint pain. Takes dexamethasone prn. Works in Holiday representative. No Ibuprofen in 2 weeks. Prior to this would take Ibuprofen twice a week for joint pain. No sick contacts, Has well water. +flatus. Nausea improved since admission. Pain improved today. Ate jello and had abdominal pain, states pain starts lower abdomen and goes up. Received Reglan at 0926. Wife states phenergan will cause extreme drowsiness.   When gets stressed, will have indigestion per wife. Had bleching and gas after funeral for father 3 weeks ago. Went away after taking Nexium.   No chronic symptoms leading up to this. No chronic PPI as outpatient. Prior to this, no unexplained weight loss or lack of appetite until now with acute illness. Typically a stress eater. Purposeful weight loss last year with wife, gained this when father got sick.   He feels his abdomen is less distended today. Nausea and abdominal pain improved.   Father passed away 3 weeks ago from respiratory failure. Wife concerned. Father diagnosed with complicated small bowel Crohn's at age 55. Complicated disease with surgical resection.  No FH Colon cancer or other IBD  Prior GI evaluations Colonoscopy 2020 by Dr. Elvin Hammer with [facility]: multiple diverticula in sigmoid  Colonoscopy 2015 Dr. Elvin Hammer: 5 mm polyp in transverse colon, diverticulosis, examined TI was normal, tubular adenoma.   EGD 2015 by Dr. Elvin Hammer: normal  FH Crohn's disease: Father  Past Medical History:  Diagnosis Date   Adenomatous polyps    Allergy    Bradycardia    occ in the 40's   GERD (gastroesophageal reflux disease)    Hypertension    Kidney stones    Sleep apnea  wears cpap     Past Surgical History:  Procedure Laterality Date   CARPAL TUNNEL RELEASE     Right & Left Hand   COLONOSCOPY     POLYPECTOMY     SHOULDER SURGERY Left    left torn bicep   WISDOM TOOTH EXTRACTION      Prior to Admission medications   Medication Sig  Start Date End Date Taking? Authorizing Provider  cetirizine (ZYRTEC) 10 MG tablet Take 10 mg by mouth daily.   Yes [provider]  dexamethasone (DECADRON) 1 MG tablet Take 1 mg by mouth as needed (joint pain).   Yes [provider]  diazepam (VALIUM) 2 MG tablet Take 2 mg by mouth every 12 (twelve) hours as needed (vertigo). 02/15/24  Yes [provider]  dicyclomine (BENTYL) 20 MG tablet Take 1 tablet (20 mg total) by mouth 2 (two) times daily. 03/20/24  Yes Merdis Stalling, MD  Multiple Vitamin (MULTIVITAMIN) tablet Take 1 tablet by mouth daily.   Yes [provider]  ondansetron (ZOFRAN-ODT) 8 MG disintegrating tablet Take 8 mg by mouth every 8 (eight) hours as needed for nausea or vomiting. 03/20/24  Yes [provider]  tamsulosin (FLOMAX) 0.4 MG CAPS capsule Take 0.4 mg by mouth daily. 06/08/19  Yes [provider]    Current Facility-Administered Medications  Medication Dose Route Frequency Provider Last Rate Last Admin   acetaminophen (TYLENOL) tablet 650 mg  650 mg Oral Q6H PRN Opyd, Santana Cue, MD       Or   acetaminophen (TYLENOL) suppository 650 mg  650 mg Rectal Q6H PRN Opyd, Santana Cue, MD       enoxaparin (LOVENOX) injection 40 mg  40 mg Subcutaneous Q24H Opyd, Timothy S, MD   40 mg at 03/21/24 1914   HYDROmorphone (DILAUDID) injection 0.5 mg  0.5 mg Intravenous Q4H PRN Opyd, Timothy S, MD   0.5 mg at 03/21/24 0045   lactated ringers infusion   Intravenous Continuous Shahmehdi, Seyed A, MD       metoCLOPramide (REGLAN) injection 5 mg  5 mg Intravenous Q8H Shahmehdi, Seyed A, MD   5 mg at 03/21/24 0926   ondansetron (ZOFRAN) tablet 4 mg  4 mg Oral Q6H PRN Opyd, Timothy S, MD   4 mg at 03/20/24 2252   Or   ondansetron (ZOFRAN) injection 4 mg  4 mg Intravenous Q6H PRN Opyd, Timothy S, MD   4 mg at 03/21/24 0426   tamsulosin (FLOMAX) capsule 0.4 mg  0.4 mg Oral QHS Opyd, Santana Cue, MD   0.4 mg at 03/20/24 2251    Allergies as of  03/20/2024 - Review Complete 03/20/2024  Allergen Reaction Noted   Codeine Nausea And Vomiting 08/31/2014    Family History  Problem Relation Age of Onset   Colon polyps Father    Crohn's disease Father    Heart disease Father    Irritable bowel syndrome Father    Kidney disease Father    Hypertension Father    Heart failure Mother    Atrial fibrillation Mother    Diabetes Paternal Grandmother    Colon cancer Maternal Aunt    Esophageal cancer Neg Hx    Rectal cancer Neg Hx    Stomach cancer Neg Hx     Social History   Socioeconomic History   Marital status: Married    Spouse name: Not on file   Number of children: 0   Years of education: Not on file  Highest education level: Not on file  Occupational History   Occupation: Holiday representative  Tobacco Use   Smoking status: Never   Smokeless tobacco: Never  Vaping Use   Vaping status: Never Used  Substance and Sexual Activity   Alcohol use: No    Alcohol/week: 0.0 standard drinks of alcohol   Drug use: No   Sexual activity: Not on file  Other Topics Concern   Not on file  Social History Narrative   Not on file   Social Drivers of Health   Financial Resource Strain: Not on file  Food Insecurity: No Food Insecurity (03/20/2024)   Hunger Vital Sign    Worried About Running Out of Food in the Last Year: Never true    Ran Out of Food in the Last Year: Never true  Transportation Needs: No Transportation Needs (03/20/2024)   PRAPARE - Administrator, Civil Service (Medical): No    Lack of Transportation (Non-Medical): No  Physical Activity: Not on file  Stress: Not on file  Social Connections: Not on file  Intimate Partner Violence: Not At Risk (03/20/2024)   Humiliation, Afraid, Rape, and Kick questionnaire    Fear of Current or Ex-Partner: No    Emotionally Abused: No    Physically Abused: No    Sexually Abused: No     Review of Systems   Gen: Denies any fever, chills, loss of appetite, change in  weight or weight loss CV: Denies chest pain, heart palpitations, syncope, edema  Resp: Denies shortness of breath with rest, cough, wheezing, coughing up blood, and pleurisy. GI: see HPI GU : Denies urinary burning, blood in urine, urinary frequency, and urinary incontinence. MS: Denies joint pain, limitation of movement, swelling, cramps, and atrophy.  Derm: Denies rash, itching, dry skin, hives. Psych: Denies depression, anxiety, memory loss, hallucinations, and confusion. Heme: Denies bruising or bleeding Neuro:  Denies any headaches, dizziness, paresthesias, shaking  Physical Exam   Vital Signs in last 24 hours: Temp:  [97.8 F (36.6 C)-98.2 F (36.8 C)] 98.2 F (36.8 C) (06/10 0418) Pulse Rate:  [55-63] 56 (06/10 0418) Resp:  [16-18] 17 (06/10 0418) BP: (90-120)/(55-71) 90/55 (06/10 0418) SpO2:  [93 %-98 %] 96 % (06/10 0418) FiO2 (%):  [21 %] 21 % (06/09 2300) Weight:  [115.7 kg-120.2 kg] 119.1 kg (06/10 0418) Last BM Date : 03/20/24  General:   Alert,  sleeping without distress and easily awakened during consultation. Pleasant.  Head:  Normocephalic and atraumatic. Eyes:  Sclera clear, no icterus.    Ears:  Normal auditory acuity. Lungs:  Clear throughout to auscultation.   Heart:  S1 S2 present without murmurs Abdomen:  Soft, mildly tender to palpation above umbilicus primarily and nondistended. No masses, hepatosplenomegaly or hernias noted. Normal bowel sounds, without guarding, and without rebound.   Rectal: deferred   Msk:  Symmetrical without gross deformities. Normal posture. Extremities:  Without  edema. Neurologic:  Alert and  oriented x4. Skin:  Intact without significant lesions or rashes. Psych:  Alert and cooperative. Normal mood and affect.  Intake/Output from previous day: 06/09 0701 - 06/10 0700 In: 644.4 [I.V.:644.4] Out: 200 [Urine:200] Intake/Output this shift: No intake/output data recorded.   Labs/Studies   Recent Labs Recent Labs     03/20/24 1720 03/21/24 0359  WBC 10.2 7.7  HGB 15.2 14.2  HCT 44.3 42.0  PLT 231 217   BMET Recent Labs    03/20/24 1720 03/21/24 0359  NA 139 140  K 3.8  4.3  CL 107 106  CO2 23 27  GLUCOSE 114* 92  BUN 13 11  CREATININE 0.91 0.88  CALCIUM 8.9 8.6*   LFT Recent Labs    03/20/24 1720  PROT 6.4*  ALBUMIN 3.5  AST 18  ALT 20  ALKPHOS 42  BILITOT 0.8    C-Diff Recent Labs    03/21/24 0631  CDIFFTOX NEGATIVE    Radiology/Studies CT Angio Chest/Abd/Pel for Dissection W and/or Wo Contrast Result Date: 03/20/2024 CLINICAL DATA:  Generalized abdominal pain epigastric pain emesis EXAM: CT ANGIOGRAPHY CHEST, ABDOMEN AND PELVIS TECHNIQUE: Non-contrast CT of the chest was initially obtained. Multidetector CT imaging through the chest, abdomen and pelvis was performed using the standard protocol during bolus administration of intravenous contrast. Multiplanar reconstructed images and MIPs were obtained and reviewed to evaluate the vascular anatomy. RADIATION DOSE REDUCTION: This exam was performed according to the departmental dose-optimization program which includes automated exposure control, adjustment of the mA and/or kV according to patient size and/or use of iterative reconstruction technique. CONTRAST:  OMNIPAQUE  IOHEXOL  350 MG/ML SOLN COMPARISON:  CT 06/20/2019 FINDINGS: CTA CHEST FINDINGS Cardiovascular: Non contrasted images of the chest demonstrate no acute intramural hematoma. Nonaneurysmal aorta. No dissection. Normal cardiac size. No pericardial effusion Mediastinum/Nodes: No enlarged mediastinal, hilar, or axillary lymph nodes. Thyroid gland, trachea, and esophagus demonstrate no significant findings. Lungs/Pleura: Lungs are clear. No pleural effusion or pneumothorax. Musculoskeletal: No chest wall abnormality. No acute or significant osseous findings. Review of the MIP images confirms the above findings. CTA ABDOMEN AND PELVIS FINDINGS VASCULAR Aorta: Normal caliber  aorta without aneurysm, dissection, vasculitis or significant stenosis. Celiac: Patent without evidence of aneurysm, dissection, vasculitis or significant stenosis. SMA: Patent without evidence of aneurysm, dissection, vasculitis or significant stenosis. Renals: Both renal arteries are patent without evidence of aneurysm, dissection, vasculitis, fibromuscular dysplasia or significant stenosis. IMA: Patent without evidence of aneurysm, dissection, vasculitis or significant stenosis. Inflow: Patent without evidence of aneurysm, dissection, vasculitis or significant stenosis. Veins: Suboptimally assessed Review of the MIP images confirms the above findings. NON-VASCULAR Hepatobiliary: Hepatic steatosis. No calcified gallstone or biliary dilatation Pancreas: Unremarkable. No pancreatic ductal dilatation or surrounding inflammatory changes. Spleen: Normal in size without focal abnormality. Adrenals/Urinary Tract: Adrenal glands are normal. No hydronephrosis. Punctate nonobstructing left kidney stones. Bladder is unremarkable Stomach/Bowel: The stomach is nonenlarged. Fluid-filled mildly distended mid to distal small bowel without well-defined transition zone. No acute bowel wall thickening. Negative appendix Lymphatic: No suspicious lymph nodes Reproductive: Prostate is unremarkable. Other: No free air.  Small volume fluid in the pelvis and mesentery. Musculoskeletal: No acute osseous abnormality Review of the MIP images confirms the above findings. IMPRESSION: 1. Negative for acute aortic dissection or aneurysm. 2. Fluid-filled mildly distended mid to distal small bowel without well-defined transition zone, possible enteritis or ileus. Small volume fluid in the pelvis and mesentery. 3. Hepatic steatosis. Punctate nonobstructing left kidney stones. Electronically Signed   By: Esmeralda Hedge M.D.   On: 03/20/2024 19:56     Assessment   DARYL BEEHLER is a 53 y.o. year old male with a history of remote colonic adenomas  in 2015 and last colonoscopy normal in 2020, GERD, HTN, renal stones, sleep apnea, presenting to the ED yesterday with several days of abdominal pain, belching, nausea but no vomiting, diarrhea, and unable to tolerate oral intake. GI now consulted due to findings on CT representing possible enteritis or ileus.    Abdominal pain: located epigastric and lower abdomen, worsened postprandially, associated  nausea, and watery stool with acute onset on Saturday. Prior to this, no chronic symptoms except occasional indigestion. CTA with fluid-filled mildly distended mid to distal small bowel without well-defined transition zone, reflecting enteritis vs ileus. Thus far, Cdiff is negative, GI path panel pending, and labs unrevealing. Clinically, he has noted improvement with abdominal pain and nausea. He does not have obstructive symptoms and abdominal exam is benign. Could have had acute gastroenteritis presentation but with FH of Crohn's disease in father, recommend outpatient further evaluation with diagnostic colonoscopy and TI biopsies. If high concern for Crohn's, anticipate CTE or MRE as well. Notably, colonoscopy in 2020 with normal visualized TI, and he has had no symptoms leading up to this. Endorses use of NSAIDs routinely, however.    Wife is extremely anxious due to patient's family history and has also requested RUQ US  to assess for gallstones or CBD stones. I spent extended time with her reviewing labs, imaging, and doubt a biliary etiology present at this moment. Hepatic steatosis can be further evaluated as outpatient.   Would hold off on steroids at this point without actual diagnosis of IBD and continue supportive care, awaiting GI path panel results. Wife is eager to start steroids as she is concerned about underlying IBD. We will order CRP, sed rate, fecal cal, although this can be elevated if simply acute infectious process.     Plan / Recommendations    Continue supportive measures Add  IV BID PPI Continue with anti-emetics GI path panel pending Will review US  when available CRP, sed rate, fecal cal Can discuss hepatic steatosis as outpatient Avoid NSAIDs     03/21/2024, 10:37 AM  Delman Ferns, PhD, ANP-BC [facility]

## 2024-03-21 NOTE — Progress Notes (Signed)
 PROGRESS NOTE    Patient: Roy Fuentes                            PCP: Huston Maiers, MD                    DOB: Jan 15, 1971            DOA: 03/20/2024 ZOX:[id number]             DOS: 03/21/2024, 10:55 AM   LOS: 0 days   Date of Service: The patient was seen and examined on 03/21/2024  Subjective:   The patient was seen and examined this morning. Hemodynamically stable. Still complaining of abdominal pain, nausea improved vomiting, and had 1 episode of diarrhea  Family-wife present at bedside very concerned as patient has family history of Crohn disease  Brief Narrative:    Roy Fuentes is a 53 y.o. male with medical history significant for OSA on CPAP who presents with abdominal pain and nausea.   Patient reports 2 days of abdominal pain and nausea.  He describes pain in the upper abdomen, nausea without vomiting, and 1 loose stool today.  Pain is worse whenever he tries to eat something.  Prior to today, he had not had a BM in 2 or 3 days.  He denies fever or chills.  He denies any surgical history.  He has not been started on any new medications recently.   ED Course: Upon arrival to the ED, patient is found to be afebrile and saturating well on room air with stable BP.  Labs are most notable for normal creatinine, normal electrolytes, normal CBC, normal troponin x 2, and normal LFTs and lipase.  CT demonstrates fluid-filled mildly distended mid to distal small bowel which could reflect enteritis or ileus.   Patient was treated in the ED with a liter of LR, Dilaudid, Zofran, Protonix, and GI cocktai   Assessment & Plan:   Principal Problem:   Ileus (HCC) Active Problems:   Sleep apnea  Assessment/Plan    1. Ileus versus enteritis -Still having abdominal pain, with nausea, vomiting improved -Continue IV fluids -Trial of clear liquid diet -As needed analgesics  Wife and patient concerned, due to family history of Crohn disease-requesting medication to be started  now and GI consultation Requesting further evaluation including ultrasound of abdomen -CT abdomen pelvis reviewed-5 mm per radiology may reflect ileus versus enteritis   - Will follow-up with US  gallbladder ultrasound  -Per family request consultation for gastroenterologist evaluation has been placed   2. OSA  - CPAP while sleeping     Nutritional status:  The patient's BMI is: Body mass index is 41.13 kg/m. I agree with the assessment and plan as outlined -----------------------------------------------------------------------------------------------------------------  DVT prophylaxis:  enoxaparin (LOVENOX) injection 40 mg Start: 03/21/24 1000   Code Status:   Code Status: Full Code  Family Communication: Wife present at bedside very concerned-updated requested GI consult (due to family history of Crohn disease) -Advance care planning has been discussed.   Admission status:   Status is: Observation The patient remains OBS appropriate and will d/c before 2 midnights.   Disposition: From  - home             Planning for discharge in 1-2 days: to   Procedures:   No admission procedures for hospital encounter.   Antimicrobials:  Anti-infectives (From admission, onward)    None  Medication:   enoxaparin (LOVENOX) injection  40 mg Subcutaneous Q24H   metoCLOPramide (REGLAN) injection  5 mg Intravenous Q8H   tamsulosin  0.4 mg Oral QHS    acetaminophen **OR** acetaminophen, HYDROmorphone (DILAUDID) injection, ondansetron **OR** ondansetron (ZOFRAN) IV   Objective:   Vitals:   03/20/24 2200 03/20/24 2250 03/20/24 2300 03/21/24 0418  BP: 114/71 116/69  (!) 90/55  Pulse: 60 (!) 55  (!) 56  Resp: 18 16  17   Temp:  97.8 F (36.6 C)  98.2 F (36.8 C)  TempSrc:  Oral  Oral  SpO2: 95% 97% 95% 96%  Weight:  120.2 kg  119.1 kg  Height:  5\' 7"  (1.702 m)      Intake/Output Summary (Last 24 hours) at 03/21/2024 1055 Last data filed at 03/21/2024 4098 Gross  per 24 hour  Intake 644.44 ml  Output 200 ml  Net 444.44 ml   Filed Weights   03/20/24 1659 03/20/24 2250 03/21/24 0418  Weight: 115.7 kg 120.2 kg 119.1 kg     Physical examination:   Constitution:  Alert, cooperative, no distress,  Appears calm and comfortable  Psychiatric:   Normal and stable mood and affect, cognition intact,   HEENT:        Normocephalic, PERRL, otherwise with in Normal limits  Chest:         Chest symmetric Cardio vascular:  S1/S2, RRR, No murmure, No Rubs or Gallops  pulmonary: Clear to auscultation bilaterally, respirations unlabored, negative wheezes / crackles Abdomen: Soft, diffuse nonspecific tenderness,  non-distended, bowel sounds,no masses, no organomegaly Muscular skeletal: Limited exam - in bed, able to move all 4 extremities,   Neuro: CNII-XII intact. , normal motor and sensation, reflexes intact  Extremities: No pitting edema lower extremities, +2 pulses  Skin: Dry, warm to touch, negative for any Rashes, No open wounds Wounds: per nursing documentation   ------------------------------------------------------------------------------------------------------------------------------------------    LABs:     Latest Ref Rng & Units 03/21/2024    3:59 AM 03/20/2024    5:20 PM 06/20/2019   11:55 AM  CBC  WBC 4.0 - 10.5 K/uL 7.7  10.2  15.0   Hemoglobin 13.0 - 17.0 g/dL 11.9  14.7  82.9   Hematocrit 39.0 - 52.0 % 42.0  44.3  46.4   Platelets 150 - 400 K/uL 217  231  385       Latest Ref Rng & Units 03/21/2024    3:59 AM 03/20/2024    5:20 PM 06/20/2019   11:55 AM  CMP  Glucose 70 - 99 mg/dL 92  562  91   BUN 6 - 20 mg/dL 11  13  20    Creatinine 0.61 - 1.24 mg/dL 1.30  8.65  7.84   Sodium 135 - 145 mmol/L 140  139  139   Potassium 3.5 - 5.1 mmol/L 4.3  3.8  3.9   Chloride 98 - 111 mmol/L 106  107  106   CO2 22 - 32 mmol/L 27  23  24    Calcium 8.9 - 10.3 mg/dL 8.6  8.9  9.2   Total Protein 6.5 - 8.1 g/dL  6.4  7.5   Total Bilirubin 0.0 - 1.2  mg/dL  0.8  0.6   Alkaline Phos 38 - 126 U/L  42  59   AST 15 - 41 U/L  18  33   ALT 0 - 44 U/L  20  84        Micro Results Recent Results (from the  past 240 hours)  C Difficile Quick Screen w PCR reflex     Status: None   Collection Time: 03/21/24  6:31 AM   Specimen: STOOL  Result Value Ref Range Status   C Diff antigen NEGATIVE NEGATIVE Final   C Diff toxin NEGATIVE NEGATIVE Final   C Diff interpretation No C. difficile detected.  Final    Comment: Performed at [facility], [address] [place], [place] [address]    Radiology Reports CT Angio Chest/Abd/Pel for Dissection W and/or Wo Contrast Result Date: 03/20/2024 CLINICAL DATA:  Generalized abdominal pain epigastric pain emesis EXAM: CT ANGIOGRAPHY CHEST, ABDOMEN AND PELVIS TECHNIQUE: Non-contrast CT of the chest was initially obtained. Multidetector CT imaging through the chest, abdomen and pelvis was performed using the standard protocol during bolus administration of intravenous contrast. Multiplanar reconstructed images and MIPs were obtained and reviewed to evaluate the vascular anatomy. RADIATION DOSE REDUCTION: This exam was performed according to the departmental dose-optimization program which includes automated exposure control, adjustment of the mA and/or kV according to patient size and/or use of iterative reconstruction technique. CONTRAST:  OMNIPAQUE  IOHEXOL  350 MG/ML SOLN COMPARISON:  CT 06/20/2019 FINDINGS: CTA CHEST FINDINGS Cardiovascular: Non contrasted images of the chest demonstrate no acute intramural hematoma. Nonaneurysmal aorta. No dissection. Normal cardiac size. No pericardial effusion Mediastinum/Nodes: No enlarged mediastinal, hilar, or axillary lymph nodes. Thyroid gland, trachea, and esophagus demonstrate no significant findings. Lungs/Pleura: Lungs are clear. No pleural effusion or pneumothorax. Musculoskeletal: No chest wall abnormality. No acute or significant osseous findings. Review of the MIP  images confirms the above findings. CTA ABDOMEN AND PELVIS FINDINGS VASCULAR Aorta: Normal caliber aorta without aneurysm, dissection, vasculitis or significant stenosis. Celiac: Patent without evidence of aneurysm, dissection, vasculitis or significant stenosis. SMA: Patent without evidence of aneurysm, dissection, vasculitis or significant stenosis. Renals: Both renal arteries are patent without evidence of aneurysm, dissection, vasculitis, fibromuscular dysplasia or significant stenosis. IMA: Patent without evidence of aneurysm, dissection, vasculitis or significant stenosis. Inflow: Patent without evidence of aneurysm, dissection, vasculitis or significant stenosis. Veins: Suboptimally assessed Review of the MIP images confirms the above findings. NON-VASCULAR Hepatobiliary: Hepatic steatosis. No calcified gallstone or biliary dilatation Pancreas: Unremarkable. No pancreatic ductal dilatation or surrounding inflammatory changes. Spleen: Normal in size without focal abnormality. Adrenals/Urinary Tract: Adrenal glands are normal. No hydronephrosis. Punctate nonobstructing left kidney stones. Bladder is unremarkable Stomach/Bowel: The stomach is nonenlarged. Fluid-filled mildly distended mid to distal small bowel without well-defined transition zone. No acute bowel wall thickening. Negative appendix Lymphatic: No suspicious lymph nodes Reproductive: Prostate is unremarkable. Other: No free air.  Small volume fluid in the pelvis and mesentery. Musculoskeletal: No acute osseous abnormality Review of the MIP images confirms the above findings. IMPRESSION: 1. Negative for acute aortic dissection or aneurysm. 2. Fluid-filled mildly distended mid to distal small bowel without well-defined transition zone, possible enteritis or ileus. Small volume fluid in the pelvis and mesentery. 3. Hepatic steatosis. Punctate nonobstructing left kidney stones. Electronically Signed   By: Esmeralda Hedge M.D.   On: 03/20/2024 19:56     SIGNED: Bobbetta Burnet, MD, FHM. FAAFP. Arlin Benes - [facility] Time spent - 355 min.  In seeing, evaluating and examining the patient. Reviewing medical records, labs, drawn plan of care. [facility],  Pager (please use [email or web address] to page/ text) Please use Epic Secure Chat for non-urgent communication (7AM-7PM)  If 7PM-7AM, please contact night-coverage [email or web address], 03/21/2024, 10:55 AM

## 2024-03-21 NOTE — Progress Notes (Signed)
   03/21/24 2027  BiPAP/CPAP/SIPAP  BiPAP/CPAP/SIPAP Pt Type Adult  BiPAP/CPAP/SIPAP DREAMSTATIOND  Mask Type Nasal mask  Dentures removed? Not applicable  Mask Size Medium  EPAP 10 cmH2O  FiO2 (%) 21 %  Patient Home Machine No  Patient Home Mask No  Patient Home Tubing No  Auto Titrate No  Device Plugged into RED Power Outlet Yes   Patient using hospital CPAP independently. Unit is plugged into red outlet and ready for use. Told patient to call RT if he needed anything. Stated he would.

## 2024-03-21 NOTE — Plan of Care (Signed)

## 2024-03-22 DIAGNOSIS — R142 Eructation: Secondary | ICD-10-CM

## 2024-03-22 DIAGNOSIS — K567 Ileus, unspecified: Secondary | ICD-10-CM | POA: Diagnosis not present

## 2024-03-22 DIAGNOSIS — R110 Nausea: Secondary | ICD-10-CM | POA: Diagnosis not present

## 2024-03-22 DIAGNOSIS — R197 Diarrhea, unspecified: Secondary | ICD-10-CM

## 2024-03-22 DIAGNOSIS — R1013 Epigastric pain: Secondary | ICD-10-CM | POA: Diagnosis not present

## 2024-03-22 DIAGNOSIS — R1030 Lower abdominal pain, unspecified: Secondary | ICD-10-CM | POA: Diagnosis not present

## 2024-03-22 DIAGNOSIS — R933 Abnormal findings on diagnostic imaging of other parts of digestive tract: Secondary | ICD-10-CM

## 2024-03-22 DIAGNOSIS — Z8379 Family history of other diseases of the digestive system: Secondary | ICD-10-CM

## 2024-03-22 LAB — GASTROINTESTINAL PANEL BY PCR, STOOL (REPLACES STOOL CULTURE)

## 2024-03-22 MED ORDER — DICYCLOMINE HCL 20 MG PO TABS: 20.0000 mg | ORAL_TABLET | Freq: Two times a day (BID) | ORAL | 1 refills | Status: AC | PRN

## 2024-03-22 MED ORDER — PROBIOTIC ACIDOPHILUS PO CAPS
1.0000 | ORAL_CAPSULE | Freq: Two times a day (BID) | ORAL | 0 refills | Status: AC
Start: 2024-03-22 — End: ?

## 2024-03-22 MED ORDER — ACETAMINOPHEN 325 MG PO TABS: 650.0000 mg | ORAL_TABLET | Freq: Four times a day (QID) | ORAL | Status: AC | PRN

## 2024-03-22 NOTE — Plan of Care (Signed)
  Problem: Clinical Measurements: Goal: Ability to maintain clinical measurements within normal limits will improve Outcome: Progressing   Problem: Nutrition: Goal: Adequate nutrition will be maintained Outcome: Progressing   Problem: Pain Managment: Goal: General experience of comfort will improve and/or be controlled Outcome: Progressing

## 2024-03-22 NOTE — Progress Notes (Signed)
 Subjective: Feeling much better today. Reports an episode of vomiting last night and actually felt much better thereafter. He had a liquid stool yesterday evening but no BMs since. Abdomen is mildly sore in epigastric region but feels bentyl has really helped his pain. He is sitting in chair during our encounter and reports ambulating around the room this morning. Wife brought him an egg biscuit which he ate and tolerated well.   Objective: Vital signs in last 24 hours: Temp:  [98.2 F (36.8 C)-98.7 F (37.1 C)] 98.4 F (36.9 C) (06/11 0526) Pulse Rate:  [57-64] 57 (06/11 0526) Resp:  [16-20] 18 (06/11 0526) BP: (105-117)/(61-80) 105/67 (06/11 0526) SpO2:  [95 %-99 %] 95 % (06/11 0526) FiO2 (%):  [21 %] 21 % (06/10 2027) Weight:  [118.1 kg] 118.1 kg (06/11 0500) Last BM Date : 03/21/24 General:   Alert and oriented, pleasant Head:  Normocephalic and atraumatic. Eyes:  No icterus, sclera clear. Conjuctiva pink.  Heart:  S1, S2 present, no murmurs noted.  Lungs: Clear to auscultation bilaterally, without wheezing, rales, or rhonchi.  Abdomen:  Bowel sounds present, soft, non-distended. No HSM or hernias noted. No rebound or guarding. No masses appreciated, mildly tender in epigastric region but patient reports much improvement from yesterday  Neurologic:  Alert and  oriented x4;  grossly normal neurologically.Roy Fuentes Psych:  Alert and cooperative. Normal mood and affect.  Intake/Output from previous day: 06/10 0701 - 06/11 0700 In: 1454 [P.O.:360; I.V.:1094] Out: -  Intake/Output this shift: No intake/output data recorded.  Lab Results: Recent Labs    03/20/24 1720 03/21/24 0359  WBC 10.2 7.7  HGB 15.2 14.2  HCT 44.3 42.0  PLT 231 217   BMET Recent Labs    03/20/24 1720 03/21/24 0359  NA 139 140  K 3.8 4.3  CL 107 106  CO2 23 27  GLUCOSE 114* 92  BUN 13 11  CREATININE 0.91 0.88  CALCIUM 8.9 8.6*   LFT Recent Labs    03/20/24 1720  PROT 6.4*  ALBUMIN 3.5  AST  18  ALT 20  ALKPHOS 42  BILITOT 0.8    Studies/Results: US  Abdomen Limited RUQ (LIVER/GB) Result Date: 03/21/2024 CLINICAL DATA:  Abdominal pain for 72 hours EXAM: ULTRASOUND ABDOMEN LIMITED RIGHT UPPER QUADRANT COMPARISON:  CT angiogram 03/20/2024 FINDINGS: Gallbladder: Distended gallbladder. Some internal echoes consistent with sludge. No shadowing stones. No wall thickening or adjacent fluid. Common bile duct: Diameter: 3 mm Liver: No focal lesion identified. Within normal limits in parenchymal echogenicity. Portal vein is patent on color Doppler imaging with normal direction of blood flow towards the liver. Other: Portions of the abdomen are obscured by overlapping bowel gas and soft tissue. Please correlate with separate CT scan. IMPRESSION: Distended gallbladder with some sludge. No shadowing stones or ductal dilatation. Electronically Signed   By: Adrianna Horde M.D.   On: 03/21/2024 12:33   CT Angio Chest/Abd/Pel for Dissection W and/or Wo Contrast Result Date: 03/20/2024 CLINICAL DATA:  Generalized abdominal pain epigastric pain emesis EXAM: CT ANGIOGRAPHY CHEST, ABDOMEN AND PELVIS TECHNIQUE: Non-contrast CT of the chest was initially obtained. Multidetector CT imaging through the chest, abdomen and pelvis was performed using the standard protocol during bolus administration of intravenous contrast. Multiplanar reconstructed images and MIPs were obtained and reviewed to evaluate the vascular anatomy. RADIATION DOSE REDUCTION: This exam was performed according to the departmental dose-optimization program which includes automated exposure control, adjustment of the mA and/or kV according to patient size and/or use of  iterative reconstruction technique. CONTRAST:  OMNIPAQUE  IOHEXOL  350 MG/ML SOLN COMPARISON:  CT 06/20/2019 FINDINGS: CTA CHEST FINDINGS Cardiovascular: Non contrasted images of the chest demonstrate no acute intramural hematoma. Nonaneurysmal aorta. No dissection. Normal cardiac  size. No pericardial effusion Mediastinum/Nodes: No enlarged mediastinal, hilar, or axillary lymph nodes. Thyroid gland, trachea, and esophagus demonstrate no significant findings. Lungs/Pleura: Lungs are clear. No pleural effusion or pneumothorax. Musculoskeletal: No chest wall abnormality. No acute or significant osseous findings. Review of the MIP images confirms the above findings. CTA ABDOMEN AND PELVIS FINDINGS VASCULAR Aorta: Normal caliber aorta without aneurysm, dissection, vasculitis or significant stenosis. Celiac: Patent without evidence of aneurysm, dissection, vasculitis or significant stenosis. SMA: Patent without evidence of aneurysm, dissection, vasculitis or significant stenosis. Renals: Both renal arteries are patent without evidence of aneurysm, dissection, vasculitis, fibromuscular dysplasia or significant stenosis. IMA: Patent without evidence of aneurysm, dissection, vasculitis or significant stenosis. Inflow: Patent without evidence of aneurysm, dissection, vasculitis or significant stenosis. Veins: Suboptimally assessed Review of the MIP images confirms the above findings. NON-VASCULAR Hepatobiliary: Hepatic steatosis. No calcified gallstone or biliary dilatation Pancreas: Unremarkable. No pancreatic ductal dilatation or surrounding inflammatory changes. Spleen: Normal in size without focal abnormality. Adrenals/Urinary Tract: Adrenal glands are normal. No hydronephrosis. Punctate nonobstructing left kidney stones. Bladder is unremarkable Stomach/Bowel: The stomach is nonenlarged. Fluid-filled mildly distended mid to distal small bowel without well-defined transition zone. No acute bowel wall thickening. Negative appendix Lymphatic: No suspicious lymph nodes Reproductive: Prostate is unremarkable. Other: No free air.  Small volume fluid in the pelvis and mesentery. Musculoskeletal: No acute osseous abnormality Review of the MIP images confirms the above findings. IMPRESSION: 1. Negative for  acute aortic dissection or aneurysm. 2. Fluid-filled mildly distended mid to distal small bowel without well-defined transition zone, possible enteritis or ileus. Small volume fluid in the pelvis and mesentery. 3. Hepatic steatosis. Punctate nonobstructing left kidney stones. Electronically Signed   By: Esmeralda Hedge M.D.   On: 03/20/2024 19:56    Assessment: Roy Fuentes is a 53 year old male with PMH of GERD, HTN, renal stones, sleep apnea, who presented to the hospital with worsening episodes of epigastric abdominal pain, nausea, belching and diarrhea.   Abdominal pain/diarrhea:pain in epigastric region and lower abdomen. Worsened postprandially, with associated nausea, watery stools, acute onset Saturday. No chronic symptoms prior to onset other than some indigestions.   -CTA: fluid-filled mildly distended mid to distal small bowel without well-defined transition zone, reflecting enteritis vs ileus.   -C diff negative -GI pathogen panel pending -CRP 2, though this may be elevated from acute infectious process -Sed rate 9 -Calprotectin pending  -WBC 7.7  -albumin normal on 6/9  Has endorsed regular NSAID use  Patient is feeling much better today after he vomited last night. Was able to tolerate breakfast, no diarrhea since yesterday evening. Feels bentyl has provided a lot of relief for him. Suspect this is gastroenteritis. Recommend a daily probiotic for the next 7-10 days and bland diet as well. I had a thorough discussion with the patient and his wife regarding suspected etiology. He will follow closely with his primary GI as an outpatient.   Other differentials include NSAID induced enteritis Low suspicion for IBD given lack of chronic symptoms (last colonoscopy in 2020 with multiple diverticula)  should consider outpatient colonoscopy with TI biopsies, given family history of Crohn's disease, may benefit from CTE/MRE thereafter, pending findings.   Plan: PPI BID Continue with  anti emetics GI path pending, following for results Follow  for calprotectin results Avoid NSAIDs Outpatient hepatic steatosis management  Continue supportive measures  Daily probiotic  Patient is stable for discharge today from GI standpoint    LOS: 1 day    03/22/2024, 8:52 AM   Leeroy Lovings L. Zakari Bathe, MSN, APRN, AGNP-C Adult-Gerontology Nurse Practitioner [facility]

## 2024-03-22 NOTE — Progress Notes (Signed)
 Nsg Discharge Note  Admit Date:  03/20/2024 Discharge date: 03/22/2024   Roy Fuentes to be D/C'd Home per MD order.  AVS completed.  Copy for chart, and copy for patient signed, and dated. Patient/caregiver able to verbalize understanding.  Discharge Medication: Allergies as of 03/22/2024       Reactions   Codeine Nausea And Vomiting        Medication List     TAKE these medications    acetaminophen 325 MG tablet Commonly known as: TYLENOL Take 2 tablets (650 mg total) by mouth every 6 (six) hours as needed for mild pain (pain score 1-3) or fever (or Fever >/= 101).   cetirizine 10 MG tablet Commonly known as: ZYRTEC Take 10 mg by mouth daily.   dexamethasone 1 MG tablet Commonly known as: DECADRON Take 1 mg by mouth as needed (joint pain).   diazepam 2 MG tablet Commonly known as: VALIUM Take 2 mg by mouth every 12 (twelve) hours as needed (vertigo).   dicyclomine 20 MG tablet Commonly known as: BENTYL Take 1 tablet (20 mg total) by mouth 2 (two) times daily as needed for spasms.   multivitamin tablet Take 1 tablet by mouth daily.   ondansetron 8 MG disintegrating tablet Commonly known as: ZOFRAN-ODT Take 8 mg by mouth every 8 (eight) hours as needed for nausea or vomiting.   Probiotic Acidophilus Caps Take 1 capsule by mouth 2 (two) times daily.   tamsulosin 0.4 MG Caps capsule Commonly known as: FLOMAX Take 0.4 mg by mouth daily.        Discharge Assessment: Vitals:   03/21/24 1953 03/22/24 0526  BP: 110/61 105/67  Pulse: 63 (!) 57  Resp: 16 18  Temp: 98.7 F (37.1 C) 98.4 F (36.9 C)  SpO2: 97% 95%   Skin clean, dry and intact without evidence of skin break down, no evidence of skin tears noted. IV catheter discontinued intact. Site without signs and symptoms of complications - no redness or edema noted at insertion site, patient denies c/o pain - only slight tenderness at site.  Dressing with slight pressure applied.  D/c  Instructions-Education: Discharge instructions given to patient/family with verbalized understanding. D/c education completed with patient/family including follow up instructions, medication list, d/c activities limitations if indicated, with other d/c instructions as indicated by MD - patient able to verbalize understanding, all questions fully answered. Patient instructed to return to ED, call 911, or call MD for any changes in condition.  Patient escorted via WC, and D/C home via private auto.  Darrell Else, RN 03/22/2024 1:01 PM

## 2024-03-22 NOTE — Discharge Summary (Signed)
 Roy Fuentes, is a 53 y.o. male  DOB [birth year]  MRN [id number].  Admission date:  03/20/2024  Admitting Physician  Walton Guppy, MD  Discharge Date:  03/22/2024   Primary MD  Huston Maiers, MD  Recommendations for primary care physician for things to follow:  1)The 'BRAT' diet is suggested, then progress to diet as tolerated as symptoms abate.  -- BRAT (bananas, rice, apples, toast) -you may also consume other mild foods that ease the GI tract such as saltines, oatmeal, or boiled potatoes. Call if bloody stools, persistent diarrhea, vomiting, fever or abdominal pain.  2)May use over-the-counter probiotic as advised  3) please follow-up with your primary GI physician in [place] to discuss possible fatty liver   4) you have class III obesity---Low calorie diet, portion control and increase physical activity advised  -Body mass index is 40.78 kg/m.  Admission Diagnosis  Enteritis [K52.9] Ileus (HCC) [K56.7] Epigastric pain [R10.13]   Discharge Diagnosis  Enteritis [K52.9] Ileus (HCC) [K56.7] Epigastric pain [R10.13]    Principal Problem:   Ileus (HCC) Active Problems:   Sleep apnea      Past Medical History:  Diagnosis Date   Adenomatous polyps    Allergy    Bradycardia    occ in the 40's   GERD (gastroesophageal reflux disease)    Hypertension    diet controlled   Kidney stones    Sleep apnea    wears cpap     Past Surgical History:  Procedure Laterality Date   CARPAL TUNNEL RELEASE     Right & Left Hand   COLONOSCOPY     POLYPECTOMY     SHOULDER SURGERY Left    left torn bicep   WISDOM TOOTH EXTRACTION      HPI  from the history and physical done on the day of admission:   Patient coming from: Home    Chief Complaint: Abdominal pain, nausea    HPI: Roy Fuentes is a 53 y.o. male with medical history significant for OSA on CPAP who presents with abdominal pain  and nausea.   Patient reports 2 days of abdominal pain and nausea.  He describes pain in the upper abdomen, nausea without vomiting, and 1 loose stool today.  Pain is worse whenever he tries to eat something.  Prior to today, he had not had a BM in 2 or 3 days.  He denies fever or chills.  He denies any surgical history.  He has not been started on any new medications recently.   ED Course: Upon arrival to the ED, patient is found to be afebrile and saturating well on room air with stable BP.  Labs are most notable for normal creatinine, normal electrolytes, normal CBC, normal troponin x 2, and normal LFTs and lipase.  CT demonstrates fluid-filled mildly distended mid to distal small bowel which could reflect enteritis or ileus.   Patient was treated in the ED with a liter of LR, Dilaudid , Zofran , Protonix , and GI cocktail.   Review of Systems:  All other systems reviewed and apart from HPI, are negative.     Hospital Course:   1. Ileus versus enteritis - Stool culture/GI pathogen with Sapovirus -C. difficile negative  -Abdominal ultrasound with distended gallbladder with some sludge, no stones or dilatation --CT chest abdomen and pelvis with finding of enteritis and hepatic steatosis --Overall patient symptoms has improved significantly -Tolerating oral intake well No nausea no vomiting, no fevers -Okay to discharge with outpatient GI follow-up as advised  2. OSA  - CPAP while sleeping   3) Obesity- -Low calorie diet, portion control and increase physical activity discussed with patient -Body mass index is 40.78 kg/m.  Discharge Condition:   Follow UP   Follow-up Information     [place], Charmian Coots, MD. Schedule an appointment as soon as possible for a visit in 1 week.   Specialty: Family Medicine Why: For follow-up, As needed, If symptoms worsen Contact information: [facility] [address] [place] [address] [phone]                  Diet and Activity recommendation:  As advised  Discharge Instructions    Discharge Instructions     Call MD for:  difficulty breathing, headache or visual disturbances   Complete by: As directed    Call MD for:  persistant dizziness or light-headedness   Complete by: As directed    Call MD for:  persistant nausea and vomiting   Complete by: As directed    Call MD for:  severe uncontrolled pain   Complete by: As directed    Call MD for:  temperature >100.4   Complete by: As directed    Diet - low sodium heart healthy   Complete by: As directed    The 'BRAT' diet is suggested, then progress to diet as tolerated as symptoms abate.  -- BRAT (bananas, rice, apples, toast) -you may also consume other mild foods that ease the GI tract such as saltines, oatmeal, or boiled potatoes. -Low calorie diet, portion control   Discharge instructions   Complete by: As directed    1)The 'BRAT' diet is suggested, then progress to diet as tolerated as symptoms abate.  -- BRAT (bananas, rice, apples, toast) -you may also consume other mild foods that ease the GI tract such as saltines, oatmeal, or boiled potatoes. Call if bloody stools, persistent diarrhea, vomiting, fever or abdominal pain.  2)May use over-the-counter probiotic as advised  3) please follow-up with your primary GI physician in [place] to discuss possible fatty liver   4) you have class III obesity---Low calorie diet, portion control and increase physical activity advised  -Body mass index is 40.78 kg/m.   Increase activity slowly   Complete by: As directed        Discharge Medications     Allergies as of 03/22/2024       Reactions   Codeine Nausea And Vomiting        Medication List     TAKE these medications    acetaminophen  325 MG tablet Commonly known as: TYLENOL  Take 2 tablets (650 mg total) by mouth every 6 (six) hours as needed for mild pain (pain score 1-3) or fever (or Fever >/= 101).    cetirizine 10 MG tablet Commonly known as: ZYRTEC Take 10 mg by mouth daily.   dexamethasone 1 MG tablet Commonly known as: DECADRON Take 1 mg by mouth as needed (joint pain).   diazepam 2 MG tablet Commonly known  as: VALIUM Take 2 mg by mouth every 12 (twelve) hours as needed (vertigo).   dicyclomine  20 MG tablet Commonly known as: BENTYL  Take 1 tablet (20 mg total) by mouth 2 (two) times daily as needed for spasms.   multivitamin tablet Take 1 tablet by mouth daily.   ondansetron  8 MG disintegrating tablet Commonly known as: ZOFRAN -ODT Take 8 mg by mouth every 8 (eight) hours as needed for nausea or vomiting.   Probiotic Acidophilus Caps Take 1 capsule by mouth 2 (two) times daily.   tamsulosin  0.4 MG Caps capsule Commonly known as: FLOMAX  Take 0.4 mg by mouth daily.       Major procedures and Radiology Reports - PLEASE review detailed and final reports for all details, in brief -   US  Abdomen Limited RUQ (LIVER/GB) Result Date: 03/21/2024 CLINICAL DATA:  Abdominal pain for 72 hours EXAM: ULTRASOUND ABDOMEN LIMITED RIGHT UPPER QUADRANT COMPARISON:  CT angiogram 03/20/2024 FINDINGS: Gallbladder: Distended gallbladder. Some internal echoes consistent with sludge. No shadowing stones. No wall thickening or adjacent fluid. Common bile duct: Diameter: 3 mm Liver: No focal lesion identified. Within normal limits in parenchymal echogenicity. Portal vein is patent on color Doppler imaging with normal direction of blood flow towards the liver. Other: Portions of the abdomen are obscured by overlapping bowel gas and soft tissue. Please correlate with separate CT scan. IMPRESSION: Distended gallbladder with some sludge. No shadowing stones or ductal dilatation. Electronically Signed   By: Adrianna Horde M.D.   On: 03/21/2024 12:33   CT Angio Chest/Abd/Pel for Dissection W and/or Wo Contrast Result Date: 03/20/2024 CLINICAL DATA:  Generalized abdominal pain epigastric pain emesis EXAM: CT  ANGIOGRAPHY CHEST, ABDOMEN AND PELVIS TECHNIQUE: Non-contrast CT of the chest was initially obtained. Multidetector CT imaging through the chest, abdomen and pelvis was performed using the standard protocol during bolus administration of intravenous contrast. Multiplanar reconstructed images and MIPs were obtained and reviewed to evaluate the vascular anatomy. RADIATION DOSE REDUCTION: This exam was performed according to the departmental dose-optimization program which includes automated exposure control, adjustment of the mA and/or kV according to patient size and/or use of iterative reconstruction technique. CONTRAST:  OMNIPAQUE  IOHEXOL  350 MG/ML SOLN COMPARISON:  CT 06/20/2019 FINDINGS: CTA CHEST FINDINGS Cardiovascular: Non contrasted images of the chest demonstrate no acute intramural hematoma. Nonaneurysmal aorta. No dissection. Normal cardiac size. No pericardial effusion Mediastinum/Nodes: No enlarged mediastinal, hilar, or axillary lymph nodes. Thyroid gland, trachea, and esophagus demonstrate no significant findings. Lungs/Pleura: Lungs are clear. No pleural effusion or pneumothorax. Musculoskeletal: No chest wall abnormality. No acute or significant osseous findings. Review of the MIP images confirms the above findings. CTA ABDOMEN AND PELVIS FINDINGS VASCULAR Aorta: Normal caliber aorta without aneurysm, dissection, vasculitis or significant stenosis. Celiac: Patent without evidence of aneurysm, dissection, vasculitis or significant stenosis. SMA: Patent without evidence of aneurysm, dissection, vasculitis or significant stenosis. Renals: Both renal arteries are patent without evidence of aneurysm, dissection, vasculitis, fibromuscular dysplasia or significant stenosis. IMA: Patent without evidence of aneurysm, dissection, vasculitis or significant stenosis. Inflow: Patent without evidence of aneurysm, dissection, vasculitis or significant stenosis. Veins: Suboptimally assessed Review of the MIP  images confirms the above findings. NON-VASCULAR Hepatobiliary: Hepatic steatosis. No calcified gallstone or biliary dilatation Pancreas: Unremarkable. No pancreatic ductal dilatation or surrounding inflammatory changes. Spleen: Normal in size without focal abnormality. Adrenals/Urinary Tract: Adrenal glands are normal. No hydronephrosis. Punctate nonobstructing left kidney stones. Bladder is unremarkable Stomach/Bowel: The stomach is nonenlarged. Fluid-filled mildly distended mid to distal small  bowel without well-defined transition zone. No acute bowel wall thickening. Negative appendix Lymphatic: No suspicious lymph nodes Reproductive: Prostate is unremarkable. Other: No free air.  Small volume fluid in the pelvis and mesentery. Musculoskeletal: No acute osseous abnormality Review of the MIP images confirms the above findings. IMPRESSION: 1. Negative for acute aortic dissection or aneurysm. 2. Fluid-filled mildly distended mid to distal small bowel without well-defined transition zone, possible enteritis or ileus. Small volume fluid in the pelvis and mesentery. 3. Hepatic steatosis. Punctate nonobstructing left kidney stones. Electronically Signed   By: Esmeralda Hedge M.D.   On: 03/20/2024 19:56   Micro Results   Recent Results (from the past 240 hours)  C Difficile Quick Screen w PCR reflex     Status: None   Collection Time: 03/21/24  6:31 AM   Specimen: STOOL  Result Value Ref Range Status   C Diff antigen NEGATIVE NEGATIVE Final   C Diff toxin NEGATIVE NEGATIVE Final   C Diff interpretation No C. difficile detected.  Final    Comment: Performed at [facility], [address] [place], [place] [address]  Gastrointestinal Panel by PCR , Stool     Status: Abnormal   Collection Time: 03/21/24  6:31 AM   Specimen: STOOL  Result Value Ref Range Status   Campylobacter species NOT DETECTED NOT DETECTED Final   Plesimonas shigelloides NOT DETECTED NOT DETECTED Final   Salmonella species NOT DETECTED  NOT DETECTED Final   Yersinia enterocolitica NOT DETECTED NOT DETECTED Final   Vibrio species NOT DETECTED NOT DETECTED Final   Vibrio cholerae NOT DETECTED NOT DETECTED Final   Enteroaggregative E coli (EAEC) NOT DETECTED NOT DETECTED Final   Enteropathogenic E coli (EPEC) NOT DETECTED NOT DETECTED Final   Enterotoxigenic E coli (ETEC) NOT DETECTED NOT DETECTED Final   Shiga like toxin producing E coli (STEC) NOT DETECTED NOT DETECTED Final   Shigella/Enteroinvasive E coli (EIEC) NOT DETECTED NOT DETECTED Final   Cryptosporidium NOT DETECTED NOT DETECTED Final   Cyclospora cayetanensis NOT DETECTED NOT DETECTED Final   Entamoeba histolytica NOT DETECTED NOT DETECTED Final   Giardia lamblia NOT DETECTED NOT DETECTED Final   Adenovirus F40/41 NOT DETECTED NOT DETECTED Final   Astrovirus NOT DETECTED NOT DETECTED Final   Norovirus GI/GII NOT DETECTED NOT DETECTED Final   Rotavirus A NOT DETECTED NOT DETECTED Final   Sapovirus (I, II, IV, and V) DETECTED (A) NOT DETECTED Final    Comment: Performed at [facility], [address], [place], [place] [address]   Today   Subjective   Kiaan Overholser today has no further -nausea vomiting tolerating oral intake well No fever  Or chills   Patient has been seen and examined prior to discharge   Objective   Blood pressure 105/67, pulse (!) 57, temperature 98.4 F (36.9 C), temperature source Oral, resp. rate 18, height 5' 7 (1.702 m), weight 118.1 kg, SpO2 95%.   Intake/Output Summary (Last 24 hours) at 03/22/2024 1222 Last data filed at 03/22/2024 0556 Gross per 24 hour  Intake 1453.99 ml  Output --  Net 1453.99 ml   Exam Gen:- Awake Alert, no acute distress  HEENT:- [place].AT, No sclera icterus Neck-Supple Neck,No JVD,.  Lungs-  CTAB , good air movement bilaterally CV- S1, S2 normal, regular Abd-  +ve B.Sounds, Abd Soft, No tenderness, increased truncal adiposity Extremity/Skin:- No  edema,   good pulses Psych-affect is  appropriate, oriented x3 Neuro-no new focal deficits, no tremors    Data  Review   CBC w Diff:  Lab Results  Component Value Date   WBC 7.7 03/21/2024   HGB 14.2 03/21/2024   HCT 42.0 03/21/2024   PLT 217 03/21/2024   LYMPHOPCT 11 06/20/2019   MONOPCT 7 06/20/2019   EOSPCT 0 06/20/2019   BASOPCT 1 06/20/2019   CMP:  Lab Results  Component Value Date   NA 140 03/21/2024   K 4.3 03/21/2024   CL 106 03/21/2024   CO2 27 03/21/2024   BUN 11 03/21/2024   CREATININE 0.88 03/21/2024   PROT 6.4 (L) 03/20/2024   ALBUMIN 3.5 03/20/2024   BILITOT 0.8 03/20/2024   ALKPHOS 42 03/20/2024   AST 18 03/20/2024   ALT 20 03/20/2024   Total Discharge time is about 33 minutes  Colin Dawley M.D on 03/22/2024 at 12:22 PM  Go to [email or web address] -  for contact info  [facility] - Office  [phone]

## 2024-03-23 DIAGNOSIS — K529 Noninfective gastroenteritis and colitis, unspecified: Secondary | ICD-10-CM

## 2024-03-23 LAB — CALPROTECTIN, FECAL: Calprotectin, Fecal: 383 ug/g — ABNORMAL HIGH (ref 0–120)

## 2024-04-12 ENCOUNTER — Ambulatory Visit (INDEPENDENT_AMBULATORY_CARE_PROVIDER_SITE_OTHER): Admitting: Gastroenterology

## 2024-04-12 ENCOUNTER — Encounter (INDEPENDENT_AMBULATORY_CARE_PROVIDER_SITE_OTHER): Payer: Self-pay | Admitting: Gastroenterology

## 2024-04-12 VITALS — BP 117/73 | HR 48 | Temp 97.7°F | Ht 67.0 in | Wt 261.2 lb

## 2024-04-12 DIAGNOSIS — K529 Noninfective gastroenteritis and colitis, unspecified: Secondary | ICD-10-CM

## 2024-04-12 DIAGNOSIS — Z6841 Body Mass Index (BMI) 40.0 and over, adult: Secondary | ICD-10-CM

## 2024-04-12 DIAGNOSIS — R194 Change in bowel habit: Secondary | ICD-10-CM

## 2024-04-12 DIAGNOSIS — Z8379 Family history of other diseases of the digestive system: Secondary | ICD-10-CM | POA: Diagnosis not present

## 2024-04-12 DIAGNOSIS — K760 Fatty (change of) liver, not elsewhere classified: Secondary | ICD-10-CM | POA: Diagnosis not present

## 2024-04-12 DIAGNOSIS — K567 Ileus, unspecified: Secondary | ICD-10-CM

## 2024-04-12 NOTE — Patient Instructions (Signed)
It was very nice to meet you today, as dicussed with will plan for the following :  1) Colonoscopy

## 2024-04-12 NOTE — Progress Notes (Signed)
 Talmage Teaster Faizan Glorimar Stroope , M.D. Gastroenterology & Hepatology [facility] [address] [place], [place] [address] Primary Care Physician: Donnise Norleen Lenis, MD [facility] [address] [place] [address]  Chief Complaint: Altered bowel movements, family history of Crohn's disease  History of Present Illness: Roy Fuentes is a 53 y.o. male GERD, HTN, renal stones, sleep apnea,  who presents for evaluation of Altered bowel movements, family history of Crohn's disease  Patient was recently seen by GI as inpatient for abdominal pain nausea and diarrhea.  Patient today reports that his symptoms are very much resolved , but intermittently he will have altered bowel movements liquid stool.  Patient is eating BRAT diet. The patient denies having any nausea, vomiting, fever, chills, hematochezia, melena, hematemesis, abdominal distention, abdominal pain, diarrhea, jaundice, pruritus   Patient had intentional 40 pound weight loss in past 1 year as he used to be 300 lbs, time to calorie count and biking daily  Hospital workup normal CMP and CBC, CT angio of the abdomen and pelvis with IV contrast showed presence of mildly distended small bowel in the mid and distal small bowel without wall deficient transition point. Right upper quadrant ultrasound showed presence of distended gallbladder with some sludge but no stones. C. difficile testing was negative.   CRP slightly elevated 2.0  GI PCR positive for Sapovirus  QYk:mzenmud his father had severe Crohn's disease requiring surgeries. Father recently passed away 3 weeks ago from CHF .  Social: neg smoking, alcohol or illicit drug use Surgical: no abdominal surgeries  Past Medical History: Past Medical History:  Diagnosis Date   Adenomatous polyps    Allergy    Bradycardia    occ in the 40's   GERD (gastroesophageal reflux disease)    Hypertension    diet  controlled   Kidney stones    Sleep apnea    wears cpap     Past Surgical History: Past Surgical History:  Procedure Laterality Date   CARPAL TUNNEL RELEASE     Right & Left Hand   COLONOSCOPY     POLYPECTOMY     SHOULDER SURGERY Left    left torn bicep   WISDOM TOOTH EXTRACTION      Family History: Family History  Problem Relation Age of Onset   Colon polyps Father    Crohn's disease Father    Heart disease Father    Irritable bowel syndrome Father    Kidney disease Father    Hypertension Father    Heart failure Mother    Atrial fibrillation Mother    Diabetes Paternal Grandmother    Colon cancer Maternal Aunt    Esophageal cancer Neg Hx    Rectal cancer Neg Hx    Stomach cancer Neg Hx     Social History: Social History   Tobacco Use  Smoking Status Never  Smokeless Tobacco Never   Social History   Substance and Sexual Activity  Alcohol Use No   Alcohol/week: 0.0 standard drinks of alcohol   Social History   Substance and Sexual Activity  Drug Use No    Allergies: Allergies  Allergen Reactions   Codeine Nausea And Vomiting    Medications: Current Outpatient Medications  Medication Sig Dispense Refill   cetirizine (ZYRTEC) 10 MG tablet Take 10 mg by mouth daily.     dexamethasone (DECADRON) 1 MG tablet Take 1 mg by mouth as needed (joint pain).     diazepam (  VALIUM) 2 MG tablet Take 2 mg by mouth every 12 (twelve) hours as needed (vertigo).     dicyclomine  (BENTYL ) 20 MG tablet Take 1 tablet (20 mg total) by mouth 2 (two) times daily as needed for spasms. 20 tablet 1   Lactobacillus (PROBIOTIC ACIDOPHILUS) CAPS Take 1 capsule by mouth 2 (two) times daily. 60 capsule 0   Multiple Vitamin (MULTIVITAMIN) tablet Take 1 tablet by mouth daily.     ondansetron  (ZOFRAN -ODT) 8 MG disintegrating tablet Take 8 mg by mouth every 8 (eight) hours as needed for nausea or vomiting.     acetaminophen  (TYLENOL ) 325 MG tablet Take 2 tablets (650 mg total) by mouth  every 6 (six) hours as needed for mild pain (pain score 1-3) or fever (or Fever >/= 101). (Patient not taking: Reported on 04/12/2024)     tamsulosin  (FLOMAX ) 0.4 MG CAPS capsule Take 0.4 mg by mouth daily. (Patient not taking: Reported on 04/12/2024)     No current facility-administered medications for this visit.    Review of Systems: GENERAL: negative for malaise, night sweats HEENT: No changes in hearing or vision, no nose bleeds or other nasal problems. NECK: Negative for lumps, goiter, pain and significant neck swelling RESPIRATORY: Negative for cough, wheezing CARDIOVASCULAR: Negative for chest pain, leg swelling, palpitations, orthopnea GI: SEE HPI MUSCULOSKELETAL: Negative for joint pain or swelling, back pain, and muscle pain. SKIN: Negative for lesions, rash HEMATOLOGY Negative for prolonged bleeding, bruising easily, and swollen nodes. ENDOCRINE: Negative for cold or heat intolerance, polyuria, polydipsia and goiter. NEURO: negative for tremor, gait imbalance, syncope and seizures. The remainder of the review of systems is noncontributory.   Physical Exam: BP 117/73   Pulse (!) 48   Temp 97.7 F (36.5 C)   Ht 5' 7 (1.702 m)   Wt 261 lb 3.2 oz (118.5 kg)   BMI 40.91 kg/m  GENERAL: The patient is AO x3, in no acute distress. HEENT: Head is normocephalic and atraumatic. EOMI are intact. Mouth is well hydrated and without lesions. NECK: Supple. No masses LUNGS: Clear to auscultation. No presence of rhonchi/wheezing/rales. Adequate chest expansion HEART: RRR, normal s1 and s2. ABDOMEN: Soft, nontender, no guarding, no peritoneal signs, and nondistended. BS +. No masses.   Imaging/Labs: as above     Latest Ref Rng & Units 03/21/2024    3:59 AM 03/20/2024    5:20 PM 06/20/2019   11:55 AM  CBC  WBC 4.0 - 10.5 K/uL 7.7  10.2  15.0   Hemoglobin 13.0 - 17.0 g/dL 85.7  84.7  84.8   Hematocrit 39.0 - 52.0 % 42.0  44.3  46.4   Platelets 150 - 400 K/uL 217  231  385    No  results found for: IRON, TIBC, FERRITIN  I personally reviewed and interpreted the available labs, imaging and endoscopic files.  Impression and Plan:  Roy Fuentes is a 53 y.o. male GERD, HTN, renal stones, sleep apnea,  who presents for evaluation of Altered bowel movements, family history of Crohn's disease  #Altered bowel movements #Family history of Crohn's disease   Recent nausea and vomiting could be viral gastroenteritis as GI PCR was positive for Sapnovirus   Although patient tells me that for the past 1 year his bowel movements have been altered with liquid stools mostly Slightly elevated CRP could be in setting of viral gastroenteritis  It has been 5 years since last colonoscopy and given family history of Crohn's disease in father , patient would  benefit and reasonable to pursue ileo-colonoscopy   If above is negative and patient continues to have symptoms may pursue MRE in future  I thoroughly discussed with the patient the procedure, including the risks involved. Patient understands what the procedure involves including the benefits and any risks. Patient understands alternatives to the proposed procedure. Risks including (but not limited to) bleeding, tearing of the lining (perforation), rupture of adjacent organs, problems with heart and lung function, infection, and medication reactions. A small percentage of complications may require surgery, hospitalization, repeat endoscopic procedure, and/or transfusion.  Patient understood and agreed.   #BMI 40  #Hepatic Steatosis  Patient has imaging finding of hepatic steatosis likely MASLD given risk factors of BMI 40 and hypertension - Discussed with patient 10% body weight loss in the next 1 year      - walking at a brisk pace/biking at moderate intesity 2.5-5 hours per week     - use pedometer/step counter to track activity     - goal to lose 5-10% of initial body weight     - avoid suagry drinks and juices, use zero  calorie beverages     - increase water intake     - eat a low carb diet with plenty of veggies and fruit     - Get sufficient sleep 7-8 hrs nightly     - maitain active lifestyle     - avoid alcohol     - recommend 2-3 cups Coffee daily     - Counsel on lowering cholesterol by having a diet rich in vegetables,          protein (avoid red meats) and good fats(fish, salmon).   All questions were answered.      Gyan Cambre Faizan Joann Kulpa, MD Gastroenterology and Hepatology [facility]   This chart has been completed using [facility] Dictation software, and while attempts have been made to ensure accuracy , certain words and phrases may not be transcribed as intended

## 2024-04-19 ENCOUNTER — Telehealth: Payer: Self-pay | Admitting: *Deleted

## 2024-04-19 MED ORDER — CLENPIQ 10-3.5-12 MG-GM -GM/175ML PO SOLN: 1.0000 | ORAL | 0 refills | Status: AC

## 2024-04-19 NOTE — Telephone Encounter (Signed)
 Spoke with pt. He has been scheduled for colonoscopy with Dr. Cinderella, any room, 8/15. He wants the easiest prep to drink. Aware will send instructions via mychart.    Per [facility] Notification or Prior Authorization is not required for the requested services You are not required to submit a notification/prior authorization based on the information provided. The number above acknowledges your inquiry and our response. Please reference this number for future inquiries. Notification is not a guarantee of coverage or payment. Questions should be directed to [email or web address] > Eligibility or [phone]. Decision ID #: [id number]

## 2024-05-24 ENCOUNTER — Encounter (HOSPITAL_COMMUNITY): Payer: Self-pay

## 2024-05-24 ENCOUNTER — Other Ambulatory Visit: Payer: Self-pay

## 2024-05-24 ENCOUNTER — Encounter (HOSPITAL_COMMUNITY)
Admission: RE | Admit: 2024-05-24 | Discharge: 2024-05-24 | Disposition: A | Discharge status: Home / Self Care | Source: Ambulatory Visit | Attending: Gastroenterology | Admitting: Gastroenterology

## 2024-05-25 NOTE — Anesthesia Preprocedure Evaluation (Addendum)
 Anesthesia Evaluation  Patient identified by MRN, date of birth, ID band Patient awake    Reviewed: Allergy & Precautions, H&P , NPO status , Patient's Chart, lab work & pertinent test results, reviewed documented beta blocker date and time   Airway Mallampati: III  TM Distance: >3 FB Neck ROM: full    Dental no notable dental hx. (+) Dental Advisory Given, Teeth Intact   Pulmonary sleep apnea    Pulmonary exam normal breath sounds clear to auscultation       Cardiovascular Exercise Tolerance: Good hypertension, Normal cardiovascular exam Rhythm:regular Rate:Normal  bradycardia   Neuro/Psych Cervical radiculopathy  Neuromuscular disease  negative psych ROS   GI/Hepatic Neg liver ROS,GERD  ,,  Endo/Other    Class 3 obesity  Renal/GU negative Renal ROS  negative genitourinary   Musculoskeletal   Abdominal  (+) + obese  Peds  Hematology negative hematology ROS (+)   Anesthesia Other Findings   Reproductive/Obstetrics negative OB ROS                              Anesthesia Physical Anesthesia Plan  ASA: 3  Anesthesia Plan: General   Post-op Pain Management: Minimal or no pain anticipated   Induction: Intravenous  PONV Risk Score and Plan: Propofol  infusion  Airway Management Planned: Nasal Cannula and Natural Airway  Additional Equipment: None  Intra-op Plan:   Post-operative Plan:   Informed Consent: I have reviewed the patients History and Physical, chart, labs and discussed the procedure including the risks, benefits and alternatives for the proposed anesthesia with the patient or authorized representative who has indicated his/her understanding and acceptance.     Dental Advisory Given  Plan Discussed with: CRNA  Anesthesia Plan Comments:          Anesthesia Quick Evaluation

## 2024-05-26 ENCOUNTER — Other Ambulatory Visit: Payer: Self-pay

## 2024-05-26 ENCOUNTER — Ambulatory Visit (HOSPITAL_COMMUNITY): Admitting: Anesthesiology

## 2024-05-26 ENCOUNTER — Ambulatory Visit (HOSPITAL_COMMUNITY)
Admission: RE | Admit: 2024-05-26 | Discharge: 2024-05-26 | Disposition: A | Discharge status: Home / Self Care | Attending: Gastroenterology | Admitting: Gastroenterology

## 2024-05-26 ENCOUNTER — Ambulatory Visit (HOSPITAL_BASED_OUTPATIENT_CLINIC_OR_DEPARTMENT_OTHER): Admitting: Anesthesiology

## 2024-05-26 ENCOUNTER — Encounter (INDEPENDENT_AMBULATORY_CARE_PROVIDER_SITE_OTHER): Payer: Self-pay | Admitting: *Deleted

## 2024-05-26 ENCOUNTER — Encounter (HOSPITAL_COMMUNITY): Payer: Self-pay | Admitting: Gastroenterology

## 2024-05-26 ENCOUNTER — Encounter (HOSPITAL_COMMUNITY)
Admission: RE | Disposition: A | Discharge status: Home / Self Care | Payer: Self-pay | Source: Home / Self Care | Attending: Gastroenterology

## 2024-05-26 DIAGNOSIS — K5730 Diverticulosis of large intestine without perforation or abscess without bleeding: Secondary | ICD-10-CM | POA: Insufficient documentation

## 2024-05-26 DIAGNOSIS — D123 Benign neoplasm of transverse colon: Secondary | ICD-10-CM

## 2024-05-26 DIAGNOSIS — K828 Other specified diseases of gallbladder: Secondary | ICD-10-CM | POA: Insufficient documentation

## 2024-05-26 DIAGNOSIS — K219 Gastro-esophageal reflux disease without esophagitis: Secondary | ICD-10-CM | POA: Diagnosis not present

## 2024-05-26 DIAGNOSIS — K6389 Other specified diseases of intestine: Secondary | ICD-10-CM | POA: Diagnosis not present

## 2024-05-26 DIAGNOSIS — G4730 Sleep apnea, unspecified: Secondary | ICD-10-CM | POA: Diagnosis not present

## 2024-05-26 DIAGNOSIS — K909 Intestinal malabsorption, unspecified: Secondary | ICD-10-CM

## 2024-05-26 DIAGNOSIS — E66813 Obesity, class 3: Secondary | ICD-10-CM | POA: Insufficient documentation

## 2024-05-26 DIAGNOSIS — Z8249 Family history of ischemic heart disease and other diseases of the circulatory system: Secondary | ICD-10-CM | POA: Diagnosis not present

## 2024-05-26 DIAGNOSIS — R194 Change in bowel habit: Secondary | ICD-10-CM | POA: Diagnosis present

## 2024-05-26 DIAGNOSIS — I10 Essential (primary) hypertension: Secondary | ICD-10-CM | POA: Insufficient documentation

## 2024-05-26 DIAGNOSIS — K648 Other hemorrhoids: Secondary | ICD-10-CM | POA: Insufficient documentation

## 2024-05-26 DIAGNOSIS — Z6841 Body Mass Index (BMI) 40.0 and over, adult: Secondary | ICD-10-CM | POA: Insufficient documentation

## 2024-05-26 DIAGNOSIS — Z8379 Family history of other diseases of the digestive system: Secondary | ICD-10-CM | POA: Diagnosis not present

## 2024-05-26 HISTORY — PX: COLONOSCOPY: SHX5424

## 2024-05-26 LAB — HM COLONOSCOPY

## 2024-05-26 SURGERY — COLONOSCOPY
Anesthesia: General

## 2024-05-26 MED ORDER — LIDOCAINE 2% (20 MG/ML) 5 ML SYRINGE
INTRAMUSCULAR | Status: DC | PRN
  Administered 2024-05-26: 60 mg via INTRAVENOUS

## 2024-05-26 MED ORDER — PROPOFOL 10 MG/ML IV BOLUS
INTRAVENOUS | Status: DC | PRN
  Administered 2024-05-26: 125 ug/kg/min via INTRAVENOUS
  Administered 2024-05-26: 100 mg via INTRAVENOUS

## 2024-05-26 MED ORDER — LACTATED RINGERS IV SOLN: INTRAVENOUS | Status: DC

## 2024-05-26 MED ORDER — GLYCOPYRROLATE PF 0.2 MG/ML IJ SOSY
PREFILLED_SYRINGE | INTRAMUSCULAR | Status: DC | PRN
  Administered 2024-05-26: .2 mg via INTRAVENOUS

## 2024-05-26 MED ORDER — LACTATED RINGERS IV SOLN: INTRAVENOUS | Status: DC | PRN

## 2024-05-26 NOTE — Transfer of Care (Signed)
 Immediate Anesthesia Transfer of Care Note  Patient: Roy Fuentes  Procedure(s) Performed: COLONOSCOPY  Patient Location: PACU  Anesthesia Type:General  Level of Consciousness: awake, alert , drowsy, and patient cooperative  Airway & Oxygen Therapy: Patient Spontanous Breathing and Patient connected to nasal cannula oxygen  Post-op Assessment: Report given to RN, Post -op Vital signs reviewed and stable, and Patient moving all extremities X 4  Post vital signs: Reviewed and stable  Last Vitals:  Vitals Value Taken Time  BP 94/48   Temp 97.8   Pulse 58   Resp 13   SpO2 99     Last Pain:  Vitals:   05/26/24 0744  TempSrc:   PainSc: 0-No pain         Complications: No notable events documented.

## 2024-05-26 NOTE — Op Note (Signed)
 [facility] Patient Name: Roy Fuentes Procedure Date: 05/26/2024 7:09 AM MRN: [id number] Date of Birth: [birth year] Attending MD: Deatrice Dine , MD, [id number] CSN: [id number] Age: 53 Admit Type: Outpatient Procedure:                Colonoscopy Indications:              Change in bowel habits Providers:                Deatrice Dine, MD, Harlene Lips, Jon Loge Referring MD:             Deatrice Dine, MD Medicines:                Monitored Anesthesia Care Complications:            No immediate complications. Estimated Blood Loss:     Estimated blood loss was minimal. Procedure:                Pre-Anesthesia Assessment:                           - Prior to the procedure, a History and Physical                            was performed, and patient medications and                            allergies were reviewed. The patient's tolerance of                            previous anesthesia was also reviewed. The risks                            and benefits of the procedure and the sedation                            options and risks were discussed with the patient.                            All questions were answered, and informed consent                            was obtained. Prior Anticoagulants: The patient has                            taken no anticoagulant or antiplatelet agents. ASA                            Grade Assessment: II - A patient with mild systemic                            disease. After reviewing the risks and benefits,  the patient was deemed in satisfactory condition to                            undergo the procedure.                           After obtaining informed consent, the colonoscope                            was passed under direct vision. Throughout the                            procedure, the patient's blood pressure, pulse, and                            oxygen saturations  were monitored continuously. The                            CH-HQ190L ([id number]) Colon was introduced through                            the anus and advanced to the the terminal ileum.                            The colonoscopy was performed without difficulty.                            The patient tolerated the procedure well. The                            quality of the bowel preparation was evaluated                            using the BBPS [facility] Bowel Preparation Scale)                            with scores of: Right Colon = 3, Transverse Colon =                            3 and Left Colon = 3 (entire mucosa seen well with                            no residual staining, small fragments of stool or                            opaque liquid). The total BBPS score equals 9. The                            terminal ileum, ileocecal valve, appendiceal                            orifice, and rectum were photographed. Scope In: 7:50:59 AM Scope Out: 8:09:41 AM Scope Withdrawal Time: 0 hours 14  minutes 17 seconds  Total Procedure Duration: 0 hours 18 minutes 42 seconds  Findings:      The perianal and digital rectal examinations were normal.      Two sessile polyps were found in the transverse colon. The polyps were 3       to 5 mm in size. These polyps were removed with a cold snare. Resection       and retrieval were complete.      There is no endoscopic evidence of inflammation in the entire colon.       Biopsies were obtained in the entire colon with cold forceps for       histology.      An area of mucosa in the terminal ileum was granular. Biopsies were       taken with a cold forceps for histology.      A few small-mouthed diverticula were found in the left colon.      Non-bleeding internal hemorrhoids were found during retroflexion. The       hemorrhoids were small. Impression:               - Two 3 to 5 mm polyps in the transverse colon,                            removed  with a cold snare. Resected and retrieved.                           - Granularity in the terminal ileum. Biopsied.                           - Diverticulosis in the left colon.                           - Non-bleeding internal hemorrhoids.                           - Biopsies were obtained in the entire colon. Moderate Sedation:      Per Anesthesia Care Recommendation:           - Patient has a contact number available for                            emergencies. The signs and symptoms of potential                            delayed complications were discussed with the                            patient. Return to normal activities tomorrow.                            Written discharge instructions were provided to the                            patient.                           - Resume previous diet.                           -  Continue present medications.                           - Await pathology results.                           - Repeat colonoscopy in 7-10 years for surveillance                            based on pathology results.                           - Return to GI office as previously scheduled. Procedure Code(s):        --- Professional ---                           [phone], Colonoscopy, flexible; with removal of                            tumor(s), polyp(s), or other lesion(s) by snare                            technique                           45380, 59, Colonoscopy, flexible; with biopsy,                            single or multiple Diagnosis Code(s):        --- Professional ---                           D12.3, Benign neoplasm of transverse colon (hepatic                            flexure or splenic flexure)                           K63.89, Other specified diseases of intestine                           K64.8, Other hemorrhoids                           R19.4, Change in bowel habit                           K57.30, Diverticulosis of large intestine without                             perforation or abscess without bleeding CPT copyright 2022 American Medical Association. All rights reserved. The codes documented in this report are preliminary and upon coder review may  be revised to meet current compliance requirements. Deatrice Dine, MD Deatrice Dine, MD 05/26/2024 8:19:07 AM This report has been signed electronically. Number of Addenda: 0

## 2024-05-26 NOTE — Discharge Instructions (Addendum)

## 2024-05-26 NOTE — Anesthesia Postprocedure Evaluation (Signed)
 Anesthesia Post Note  Patient: Roy Fuentes  Procedure(s) Performed: COLONOSCOPY  Patient location during evaluation: Phase II Anesthesia Type: General Level of consciousness: awake and alert Pain management: pain level controlled Vital Signs Assessment: post-procedure vital signs reviewed and stable Respiratory status: spontaneous breathing, nonlabored ventilation and respiratory function stable Cardiovascular status: blood pressure returned to baseline and stable Postop Assessment: no apparent nausea or vomiting Anesthetic complications: no   There were no known notable events for this encounter.   Last Vitals:  Vitals:   05/26/24 0633 05/26/24 0813  BP: 114/78 (!) 94/48  Pulse: (!) 55 66  Resp: 15 16  Temp: 36.6 C 36.5 C  SpO2: 99% 98%    Last Pain:  Vitals:   05/26/24 0813  TempSrc: Axillary  PainSc: 0-No pain                 Shawnice Tilmon L Denis Carreon

## 2024-05-26 NOTE — H&P (Signed)
 Primary Care Physician:  Donnise Norleen Lenis, MD Primary Gastroenterologist:  Dr. Cinderella  Pre-Procedure History & Physical: HPI:  MICAL KICKLIGHTER is a 53 y.o. male GERD, HTN, renal stones, sleep apnea,  who presents for evaluation of Altered bowel movements, family history of Crohn's disease  intermittently he will have altered bowel movements liquid stool.  Patient is eating BRAT diet. The patient denies having any nausea, vomiting, fever, chills, hematochezia, melena, hematemesis, abdominal distention, abdominal pain, diarrhea, jaundice, pruritus  Patient had intentional 40 pound weight loss in past 1 year as he used to be 300 lbs, time to calorie count and biking daily   Hospital workup normal CMP and CBC, CT angio of the abdomen and pelvis with IV contrast showed presence of mildly distended small bowel in the mid and distal small bowel without wall deficient transition point. Right upper quadrant ultrasound showed presence of distended gallbladder with some sludge but no stones. C. difficile testing was negative.    CRP slightly elevated 2.0   GI PCR positive for Sapovirus   QYk:mzenmud his father had severe Crohn's disease requiring surgeries. Father recently passed away 3 weeks ago from CHF .  Social: neg smoking, alcohol or illicit drug use Surgical: no abdominal surgeries  Past Medical History:  Diagnosis Date   Adenomatous polyps    Allergy    Bradycardia    occ in the 40's   GERD (gastroesophageal reflux disease)    Hypertension    diet controlled   Kidney stones    Sleep apnea    wears cpap     Past Surgical History:  Procedure Laterality Date   CARPAL TUNNEL RELEASE     Right & Left Hand   COLONOSCOPY     POLYPECTOMY     SHOULDER SURGERY Left    left torn bicep   WISDOM TOOTH EXTRACTION      Prior to Admission medications   Medication Sig Start Date End Date Taking? Authorizing Provider  cetirizine (ZYRTEC) 10 MG tablet Take 10 mg by mouth daily.   Yes  [provider]  dexamethasone (DECADRON) 1 MG tablet Take 1 mg by mouth as needed (joint pain).   Yes [provider]  diazepam (VALIUM) 2 MG tablet Take 2 mg by mouth every 12 (twelve) hours as needed (vertigo). 02/15/24  Yes [provider]  dicyclomine (BENTYL) 20 MG tablet Take 1 tablet (20 mg total) by mouth 2 (two) times daily as needed for spasms. 03/22/24  Yes Emokpae, Courage, MD  Lactobacillus (PROBIOTIC ACIDOPHILUS) CAPS Take 1 capsule by mouth 2 (two) times daily. 03/22/24  Yes Pearlean Manus, MD  Multiple Vitamin (MULTIVITAMIN) tablet Take 1 tablet by mouth daily.   Yes [provider]  Sod Picosulfate-Mag Ox-Cit Acd (CLENPIQ) 10-3.5-12 MG-GM -GM/175ML SOLN Take 1 kit by mouth as directed. 04/19/24  Yes Zaden Sako F, MD  tamsulosin (FLOMAX) 0.4 MG CAPS capsule Take 0.4 mg by mouth daily. 06/08/19  Yes [provider]  acetaminophen (TYLENOL) 325 MG tablet Take 2 tablets (650 mg total) by mouth every 6 (six) hours as needed for mild pain (pain score 1-3) or fever (or Fever >/= 101). Patient not taking: Reported on 04/12/2024 03/22/24   Pearlean Manus, MD  ondansetron (ZOFRAN-ODT) 8 MG disintegrating tablet Take 8 mg by mouth every 8 (eight) hours as needed for nausea or vomiting. 03/20/24   [provider]    Allergies as of 04/19/2024 - Review Complete 04/12/2024  Allergen Reaction Noted  Codeine Nausea And Vomiting 08/31/2014    Family History  Problem Relation Age of Onset   Colon polyps Father    Crohn's disease Father    Heart disease Father    Irritable bowel syndrome Father    Kidney disease Father    Hypertension Father    Heart failure Mother    Atrial fibrillation Mother    Diabetes Paternal Grandmother    Colon cancer Maternal Aunt    Esophageal cancer Neg Hx    Rectal cancer Neg Hx    Stomach cancer Neg Hx     Social History   Socioeconomic History   Marital status: Married    Spouse name: Not on file    Number of children: 0   Years of education: Not on file   Highest education level: Not on file  Occupational History   Occupation: Holiday representative  Tobacco Use   Smoking status: Never   Smokeless tobacco: Never  Vaping Use   Vaping status: Never Used  Substance and Sexual Activity   Alcohol use: No    Alcohol/week: 0.0 standard drinks of alcohol   Drug use: No   Sexual activity: Not on file  Other Topics Concern   Not on file  Social History Narrative   Not on file   Social Drivers of Health   Financial Resource Strain: Not on file  Food Insecurity: No Food Insecurity (03/20/2024)   Hunger Vital Sign    Worried About Running Out of Food in the Last Year: Never true    Ran Out of Food in the Last Year: Never true  Transportation Needs: No Transportation Needs (03/20/2024)   PRAPARE - Administrator, Civil Service (Medical): No    Lack of Transportation (Non-Medical): No  Physical Activity: Not on file  Stress: Not on file  Social Connections: Not on file  Intimate Partner Violence: Not At Risk (03/20/2024)   Humiliation, Afraid, Rape, and Kick questionnaire    Fear of Current or Ex-Partner: No    Emotionally Abused: No    Physically Abused: No    Sexually Abused: No    Review of Systems: See HPI, otherwise negative ROS  Physical Exam: Vital signs in last 24 hours: Temp:  [97.9 F (36.6 C)] 97.9 F (36.6 C) (08/15 0633) Pulse Rate:  [55] 55 (08/15 0633) Resp:  [15] 15 (08/15 0633) BP: (114)/(78) 114/78 (08/15 0633) SpO2:  [99 %] 99 % (08/15 9366) Weight:  [882 kg] 117 kg (08/15 9366)   General:   Alert,  Well-developed, well-nourished, pleasant and cooperative in NAD Head:  Normocephalic and atraumatic. Eyes:  Sclera clear, no icterus.   Conjunctiva pink. Ears:  Normal auditory acuity. Nose:  No deformity, discharge,  or lesions. Msk:  Symmetrical without gross deformities. Normal posture. Extremities:  Without clubbing or edema. Neurologic:  Alert  and  oriented x4;  grossly normal neurologically. Skin:  Intact without significant lesions or rashes. Psych:  Alert and cooperative. Normal mood and affect.  Impression/Plan: AZIR MUZYKA is a 53 y.o. male GERD, HTN, renal stones, sleep apnea,  who presents for evaluation of Altered bowel movements, family history of Crohn's disease. Proceed with ileo-colonoscopy   The risks of the procedure including infection, bleed, or perforation as well as benefits, limitations, alternatives and imponderables have been reviewed with the patient. Questions have been answered. All parties agreeable.'

## 2024-05-30 ENCOUNTER — Encounter (HOSPITAL_COMMUNITY): Payer: Self-pay | Admitting: Gastroenterology

## 2024-05-30 LAB — SURGICAL PATHOLOGY

## 2024-05-31 ENCOUNTER — Ambulatory Visit: Admitting: Internal Medicine

## 2024-06-03 ENCOUNTER — Ambulatory Visit (INDEPENDENT_AMBULATORY_CARE_PROVIDER_SITE_OTHER): Payer: Self-pay | Admitting: Gastroenterology

## 2024-06-06 NOTE — Progress Notes (Signed)
 7 yr TCS noted in recall Patient result letter mailed procedure note and pathology result faxed to PCP

## 2024-08-21 ENCOUNTER — Ambulatory Visit (INDEPENDENT_AMBULATORY_CARE_PROVIDER_SITE_OTHER): Admitting: Gastroenterology
